# Patient Record
Sex: Male | Born: 1965
Health system: Southern US, Community
[De-identification: ages and names within clinical notes are randomized; demographics above are authoritative.]

## PROBLEM LIST (undated history)

## (undated) DIAGNOSIS — F1010 Alcohol abuse, uncomplicated: Secondary | ICD-10-CM

## (undated) DIAGNOSIS — E039 Hypothyroidism, unspecified: Secondary | ICD-10-CM

## (undated) DIAGNOSIS — F329 Major depressive disorder, single episode, unspecified: Secondary | ICD-10-CM

## (undated) DIAGNOSIS — F32A Depression, unspecified: Secondary | ICD-10-CM

## (undated) DIAGNOSIS — Z201 Contact with and (suspected) exposure to tuberculosis: Secondary | ICD-10-CM

## (undated) DIAGNOSIS — E079 Disorder of thyroid, unspecified: Secondary | ICD-10-CM

## (undated) HISTORY — PX: NO PAST SURGERIES: SHX2092

---

## 2017-12-16 ENCOUNTER — Other Ambulatory Visit: Payer: Self-pay

## 2017-12-16 ENCOUNTER — Inpatient Hospital Stay (HOSPITAL_COMMUNITY)
Admission: EM | Admit: 2017-12-16 | Discharge: 2017-12-22 | DRG: 080 | Disposition: A | Discharge status: Home / Self Care | Payer: Self-pay | Attending: Internal Medicine | Admitting: Internal Medicine

## 2017-12-16 ENCOUNTER — Emergency Department (HOSPITAL_COMMUNITY): Payer: Self-pay

## 2017-12-16 ENCOUNTER — Encounter (HOSPITAL_COMMUNITY): Payer: Self-pay | Admitting: *Deleted

## 2017-12-16 DIAGNOSIS — I959 Hypotension, unspecified: Secondary | ICD-10-CM | POA: Diagnosis present

## 2017-12-16 DIAGNOSIS — Z23 Encounter for immunization: Secondary | ICD-10-CM

## 2017-12-16 DIAGNOSIS — R0602 Shortness of breath: Secondary | ICD-10-CM

## 2017-12-16 DIAGNOSIS — F10129 Alcohol abuse with intoxication, unspecified: Secondary | ICD-10-CM | POA: Diagnosis present

## 2017-12-16 DIAGNOSIS — E039 Hypothyroidism, unspecified: Secondary | ICD-10-CM | POA: Diagnosis present

## 2017-12-16 DIAGNOSIS — F17210 Nicotine dependence, cigarettes, uncomplicated: Secondary | ICD-10-CM | POA: Diagnosis present

## 2017-12-16 DIAGNOSIS — E872 Acidosis: Secondary | ICD-10-CM | POA: Diagnosis present

## 2017-12-16 DIAGNOSIS — G9341 Metabolic encephalopathy: Secondary | ICD-10-CM | POA: Diagnosis present

## 2017-12-16 DIAGNOSIS — E871 Hypo-osmolality and hyponatremia: Secondary | ICD-10-CM | POA: Diagnosis present

## 2017-12-16 DIAGNOSIS — E876 Hypokalemia: Secondary | ICD-10-CM | POA: Diagnosis not present

## 2017-12-16 DIAGNOSIS — E035 Myxedema coma: Principal | ICD-10-CM | POA: Diagnosis present

## 2017-12-16 DIAGNOSIS — Z590 Homelessness: Secondary | ICD-10-CM

## 2017-12-16 DIAGNOSIS — X31XXXA Exposure to excessive natural cold, initial encounter: Secondary | ICD-10-CM

## 2017-12-16 DIAGNOSIS — K5900 Constipation, unspecified: Secondary | ICD-10-CM

## 2017-12-16 DIAGNOSIS — D539 Nutritional anemia, unspecified: Secondary | ICD-10-CM | POA: Diagnosis present

## 2017-12-16 DIAGNOSIS — J449 Chronic obstructive pulmonary disease, unspecified: Secondary | ICD-10-CM | POA: Diagnosis present

## 2017-12-16 DIAGNOSIS — E869 Volume depletion, unspecified: Secondary | ICD-10-CM | POA: Diagnosis present

## 2017-12-16 DIAGNOSIS — Z9114 Patient's other noncompliance with medication regimen: Secondary | ICD-10-CM

## 2017-12-16 DIAGNOSIS — T68XXXA Hypothermia, initial encounter: Secondary | ICD-10-CM | POA: Diagnosis present

## 2017-12-16 HISTORY — DX: Hypothyroidism, unspecified: E03.9

## 2017-12-16 HISTORY — DX: Depression, unspecified: F32.A

## 2017-12-16 HISTORY — DX: Disorder of thyroid, unspecified: E07.9

## 2017-12-16 HISTORY — DX: Contact with and (suspected) exposure to tuberculosis: Z20.1

## 2017-12-16 HISTORY — DX: Major depressive disorder, single episode, unspecified: F32.9

## 2017-12-16 LAB — BASIC METABOLIC PANEL
Anion gap: 8 (ref 5–15)
Anion gap: 7 (ref 5–15)
BUN: 7 mg/dL (ref 6–20)
CHLORIDE: 94 mmol/L — AB (ref 101–111)
CHLORIDE: 95 mmol/L — AB (ref 101–111)
CO2: 20 mmol/L — ABNORMAL LOW (ref 22–32)
CO2: 21 mmol/L — ABNORMAL LOW (ref 22–32)
CREATININE: 0.73 mg/dL (ref 0.61–1.24)
CREATININE: 0.81 mg/dL (ref 0.61–1.24)
Calcium: 7.5 mg/dL — ABNORMAL LOW (ref 8.9–10.3)
Calcium: 7.6 mg/dL — ABNORMAL LOW (ref 8.9–10.3)
GFR calc Af Amer: >60 mL/min (ref >60)
GFR calc Af Amer: >60 mL/min (ref >60)
GFR calc non Af Amer: >60 mL/min (ref >60)
GLUCOSE: 67 mg/dL (ref 65–99)
GLUCOSE: 57 mg/dL — AB (ref 65–99)
POTASSIUM: 3.6 mmol/L (ref 3.5–5.1)
Potassium: 3.4 mmol/L — ABNORMAL LOW (ref 3.5–5.1)
SODIUM: 122 mmol/L — AB (ref 135–145)
SODIUM: 123 mmol/L — AB (ref 135–145)

## 2017-12-16 LAB — I-STAT ARTERIAL BLOOD GAS, ED
Acid-base deficit: 5 mmol/L — ABNORMAL HIGH (ref 0.0–2.0)
Bicarbonate: 20.6 mmol/L (ref 20.0–28.0)
O2 SAT: 86 %
PCO2 ART: 29.7 mmHg — AB (ref 32.0–48.0)
PH ART: 7.424 (ref 7.350–7.450)
Patient temperature: 88.6
TCO2: 22 mmol/L (ref 22–32)
pO2, Arterial: 36 mmHg — CL (ref 83.0–108.0)

## 2017-12-16 LAB — COMPREHENSIVE METABOLIC PANEL
ALBUMIN: 3.1 g/dL — AB (ref 3.5–5.0)
ALT: 52 U/L (ref 17–63)
AST: 246 U/L — AB (ref 15–41)
Alkaline Phosphatase: 78 U/L (ref 38–126)
Anion gap: 18 — ABNORMAL HIGH (ref 5–15)
BUN: 6 mg/dL (ref 6–20)
CHLORIDE: 84 mmol/L — AB (ref 101–111)
CO2: 17 mmol/L — AB (ref 22–32)
CREATININE: 0.71 mg/dL (ref 0.61–1.24)
Calcium: 8.5 mg/dL — ABNORMAL LOW (ref 8.9–10.3)
GFR calc Af Amer: >60 mL/min (ref >60)
GLUCOSE: 107 mg/dL — AB (ref 65–99)
POTASSIUM: 3 mmol/L — AB (ref 3.5–5.1)
Sodium: 119 mmol/L — CL (ref 135–145)
Total Bilirubin: 0.7 mg/dL (ref 0.3–1.2)
Total Protein: 5.7 g/dL — ABNORMAL LOW (ref 6.5–8.1)

## 2017-12-16 LAB — PROTIME-INR
INR: 0.91
PROTHROMBIN TIME: 12.1 s (ref 11.4–15.2)

## 2017-12-16 LAB — CBC WITH DIFFERENTIAL/PLATELET
BASOS ABS: 0 10*3/uL (ref 0.0–0.1)
BASOS PCT: 0 %
EOS PCT: 0 %
Eosinophils Absolute: 0 10*3/uL (ref 0.0–0.7)
HCT: 23.6 % — ABNORMAL LOW (ref 39.0–52.0)
Hemoglobin: 8.4 g/dL — ABNORMAL LOW (ref 13.0–17.0)
Lymphocytes Relative: 13 %
Lymphs Abs: 0.7 10*3/uL (ref 0.7–4.0)
MCH: 34.9 pg — ABNORMAL HIGH (ref 26.0–34.0)
MCHC: 35.6 g/dL (ref 30.0–36.0)
MCV: 97.9 fL (ref 78.0–100.0)
Monocytes Absolute: 0.3 10*3/uL (ref 0.1–1.0)
Monocytes Relative: 6 %
NEUTROS ABS: 4 10*3/uL (ref 1.7–7.7)
Neutrophils Relative %: 81 %
PLATELETS: 157 10*3/uL (ref 150–400)
RBC: 2.41 MIL/uL — AB (ref 4.22–5.81)
RDW: 13 % (ref 11.5–15.5)
WBC: 5 10*3/uL (ref 4.0–10.5)

## 2017-12-16 LAB — GLUCOSE, CAPILLARY
GLUCOSE-CAPILLARY: 61 mg/dL — AB (ref 65–99)
Glucose-Capillary: 133 mg/dL — ABNORMAL HIGH (ref 65–99)

## 2017-12-16 LAB — CORTISOL: CORTISOL PLASMA: 15.1 ug/dL

## 2017-12-16 LAB — URINALYSIS, ROUTINE W REFLEX MICROSCOPIC
BACTERIA UA: NONE SEEN
Glucose, UA: NEGATIVE mg/dL
HGB URINE DIPSTICK: NEGATIVE
KETONES UR: 20 mg/dL — AB
Nitrite: NEGATIVE
Protein, ur: NEGATIVE mg/dL
SPECIFIC GRAVITY, URINE: 1.024 (ref 1.005–1.030)
SQUAMOUS EPITHELIAL / LPF: NONE SEEN
pH: 5 (ref 5.0–8.0)

## 2017-12-16 LAB — I-STAT CG4 LACTIC ACID, ED
Lactic Acid, Venous: 6.48 mmol/L (ref 0.5–1.9)
Lactic Acid, Venous: 1.89 mmol/L (ref 0.5–1.9)

## 2017-12-16 LAB — TSH: TSH: 17.428 u[IU]/mL — ABNORMAL HIGH (ref 0.350–4.500)

## 2017-12-16 LAB — RAPID URINE DRUG SCREEN, HOSP PERFORMED
AMPHETAMINES: NOT DETECTED
Barbiturates: NOT DETECTED
Benzodiazepines: NOT DETECTED
COCAINE: NOT DETECTED
OPIATES: NOT DETECTED
Tetrahydrocannabinol: POSITIVE — AB

## 2017-12-16 LAB — TROPONIN I: Troponin I: <0.03 ng/mL (ref <0.03)

## 2017-12-16 LAB — T4, FREE: Free T4: <0.25 ng/dL — ABNORMAL LOW (ref 0.61–1.12)

## 2017-12-16 LAB — ACETAMINOPHEN LEVEL: Acetaminophen (Tylenol), Serum: <10 ug/mL — ABNORMAL LOW (ref 10–30)

## 2017-12-16 LAB — MAGNESIUM: MAGNESIUM: 1.8 mg/dL (ref 1.7–2.4)

## 2017-12-16 LAB — ETHANOL

## 2017-12-16 LAB — SALICYLATE LEVEL: Salicylate Lvl: <7 mg/dL (ref 2.8–30.0)

## 2017-12-16 LAB — AMMONIA: Ammonia: 17 umol/L (ref 9–35)

## 2017-12-16 LAB — LIPASE, BLOOD: Lipase: 82 U/L — ABNORMAL HIGH (ref 11–51)

## 2017-12-16 MED ORDER — LEVOTHYROXINE SODIUM 100 MCG IV SOLR
50.0000 ug | Freq: Every day | INTRAVENOUS | Status: DC
  Administered 2017-12-17: 50 ug via INTRAVENOUS
  Filled 2017-12-16 (×2): qty 5

## 2017-12-16 MED ORDER — VANCOMYCIN HCL IN DEXTROSE 1-5 GM/200ML-% IV SOLN
1000.0000 mg | Freq: Three times a day (TID) | INTRAVENOUS | Status: DC
  Administered 2017-12-17 – 2017-12-18 (×5): 1000 mg via INTRAVENOUS
  Filled 2017-12-16 (×6): qty 200

## 2017-12-16 MED ORDER — PIPERACILLIN-TAZOBACTAM 3.375 G IVPB
3.3750 g | Freq: Three times a day (TID) | INTRAVENOUS | Status: DC
  Administered 2017-12-16 – 2017-12-18 (×6): 3.375 g via INTRAVENOUS
  Filled 2017-12-16 (×7): qty 50

## 2017-12-16 MED ORDER — HEPARIN SODIUM (PORCINE) 5000 UNIT/ML IJ SOLN
5000.0000 [IU] | Freq: Three times a day (TID) | INTRAMUSCULAR | Status: DC
  Administered 2017-12-16 – 2017-12-22 (×14): 5000 [IU] via SUBCUTANEOUS
  Filled 2017-12-16 (×16): qty 1

## 2017-12-16 MED ORDER — VANCOMYCIN HCL 10 G IV SOLR
1500.0000 mg | Freq: Once | INTRAVENOUS | Status: AC
  Administered 2017-12-16: 1500 mg via INTRAVENOUS
  Filled 2017-12-16: qty 1500

## 2017-12-16 MED ORDER — DEXTROSE 50 % IV SOLN
50.0000 mL | Freq: Once | INTRAVENOUS | Status: AC
  Administered 2017-12-16: 50 mL via INTRAVENOUS
  Filled 2017-12-16: qty 50

## 2017-12-16 MED ORDER — PIPERACILLIN-TAZOBACTAM 3.375 G IVPB 30 MIN
3.3750 g | Freq: Once | INTRAVENOUS | Status: AC
  Administered 2017-12-16: 3.375 g via INTRAVENOUS
  Filled 2017-12-16: qty 50

## 2017-12-16 MED ORDER — VANCOMYCIN HCL IN DEXTROSE 1-5 GM/200ML-% IV SOLN: 1000.0000 mg | Freq: Once | INTRAVENOUS | Status: DC

## 2017-12-16 MED ORDER — SODIUM CHLORIDE 0.9 % IV BOLUS (SEPSIS)
1000.0000 mL | Freq: Once | INTRAVENOUS | Status: AC
  Administered 2017-12-16: 1000 mL via INTRAVENOUS

## 2017-12-16 MED ORDER — DEXTROSE 50 % IV SOLN: 50.0000 mL | Freq: Once | INTRAVENOUS | Status: DC

## 2017-12-16 MED ORDER — HYDROCORTISONE NA SUCCINATE PF 100 MG IJ SOLR
100.0000 mg | Freq: Three times a day (TID) | INTRAMUSCULAR | Status: DC
  Administered 2017-12-16 – 2017-12-20 (×12): 100 mg via INTRAVENOUS
  Filled 2017-12-16 (×13): qty 2

## 2017-12-16 MED ORDER — ONDANSETRON HCL 4 MG/2ML IJ SOLN: 4.0000 mg | Freq: Four times a day (QID) | INTRAMUSCULAR | Status: DC | PRN

## 2017-12-16 MED ORDER — SODIUM CHLORIDE 0.9 % IV SOLN
INTRAVENOUS | Status: DC
  Administered 2017-12-16 – 2017-12-19 (×6): via INTRAVENOUS

## 2017-12-16 MED ORDER — POTASSIUM CHLORIDE 10 MEQ/100ML IV SOLN
10.0000 meq | INTRAVENOUS | Status: AC
  Administered 2017-12-16 (×3): 10 meq via INTRAVENOUS
  Filled 2017-12-16 (×3): qty 100

## 2017-12-16 MED ORDER — ALBUTEROL SULFATE (2.5 MG/3ML) 0.083% IN NEBU
2.5000 mg | INHALATION_SOLUTION | RESPIRATORY_TRACT | Status: DC | PRN
  Filled 2017-12-16: qty 3

## 2017-12-16 MED ORDER — SODIUM CHLORIDE 0.9 % IV SOLN: 250.0000 mL | INTRAVENOUS | Status: DC | PRN

## 2017-12-16 MED ORDER — SODIUM CHLORIDE 0.9 % IV BOLUS (SEPSIS)
1000.0000 mL | Freq: Once | INTRAVENOUS | Status: AC
Start: 2017-12-16 — End: 2017-12-16
  Administered 2017-12-16: 1000 mL via INTRAVENOUS

## 2017-12-16 NOTE — ED Provider Notes (Signed)
[facility] EMERGENCY DEPARTMENT Provider Note   CSN: [id number] Arrival date & time: 12/16/17  0703     History   Chief Complaint Chief Complaint  Patient presents with  . Alcohol Intoxication    HPI Bruce Simpson is a 52 y.o. male.  The history is provided by the patient and the EMS personnel. No language interpreter was used.  Alcohol Intoxication  This is a recurrent problem. The current episode started 12 to 24 hours ago. The problem occurs constantly. The problem has not changed since onset.Pertinent negatives include no chest pain, no abdominal pain, no headaches and no shortness of breath. Nothing aggravates the symptoms. Nothing relieves the symptoms. He has tried nothing for the symptoms. The treatment provided no relief.    Past Medical History:  Diagnosis Date  . Thyroid disease     There are no active problems to display for this patient.    Home Medications    Prior to Admission medications   Not on File    Family History No family history on file.  Social History Social History   Tobacco Use  . Smoking status: Current Some Day Smoker  . Smokeless tobacco: Never Used  Substance Use Topics  . Alcohol use: Yes  . Drug use: No     Allergies   Patient has no known allergies.   Review of Systems Review of Systems  Constitutional: Positive for fatigue. Negative for appetite change, chills, diaphoresis and fever.  HENT: Negative for congestion.   Eyes: Negative for visual disturbance.  Respiratory: Negative for cough, chest tightness, shortness of breath, wheezing and stridor.   Cardiovascular: Negative for chest pain, palpitations and leg swelling.  Gastrointestinal: Negative for abdominal pain, constipation, diarrhea, nausea and vomiting.  Genitourinary: Positive for difficulty urinating and frequency. Negative for discharge, dysuria, hematuria, penile pain, penile swelling, scrotal swelling and testicular pain.    Musculoskeletal: Negative for back pain, neck pain and neck stiffness.  Skin: Negative for rash and wound.  Neurological: Negative for dizziness, seizures, syncope, speech difficulty, weakness, light-headedness, numbness and headaches.  Psychiatric/Behavioral: Positive for confusion. Negative for agitation.       Pt feels "discombobulated"   All other systems reviewed and are negative.    Physical Exam Updated Vital Signs BP (!) 85/66   Pulse (!) 53   Temp (!) 88.6 F (31.4 C) (Rectal)   Resp 11   Ht 6\' 1"  (1.854 m)   Wt 90.7 kg (200 lb)   SpO2 98%   BMI 26.39 kg/m   Physical Exam  Constitutional: He is oriented to person, place, and time. He appears well-developed and well-nourished. No distress.  Covered in mud  HENT:  Head: Normocephalic and atraumatic.  Nose: Nose normal.  Mouth/Throat: Oropharynx is clear and moist. No oropharyngeal exudate.  Eyes: Conjunctivae and EOM are normal. Pupils are equal, round, and reactive to light.  Neck: Normal range of motion. Neck supple.  Cardiovascular: Normal heart sounds and intact distal pulses. Bradycardia present.  No murmur heard. Pulmonary/Chest: Effort normal and breath sounds normal. No stridor. No respiratory distress. He has no wheezes. He exhibits no tenderness.  Abdominal: Soft. Bowel sounds are normal. He exhibits no distension. There is no tenderness.  Musculoskeletal: He exhibits no edema, tenderness or deformity.  Neurological: He is alert and oriented to person, place, and time. No cranial nerve deficit or sensory deficit. He exhibits normal muscle tone. Coordination (gait deferred initiallu) normal.  Skin: Capillary refill takes less than  2 seconds. He is not diaphoretic. No erythema. No pallor.  Nursing note and vitals reviewed.    ED Treatments / Results  Labs (all labs ordered are listed, but only abnormal results are displayed) Labs Reviewed  CBC WITH DIFFERENTIAL/PLATELET - Abnormal; Notable for the  following components:      Result Value   RBC 2.41 (*)    Hemoglobin 8.4 (*)    HCT 23.6 (*)    MCH 34.9 (*)    All other components within normal limits  COMPREHENSIVE METABOLIC PANEL - Abnormal; Notable for the following components:   Sodium 119 (*)    Potassium 3.0 (*)    Chloride 84 (*)    CO2 17 (*)    Glucose, Bld 107 (*)    Calcium 8.5 (*)    Total Protein 5.7 (*)    Albumin 3.1 (*)    AST 246 (*)    Anion gap 18 (*)    All other components within normal limits  LIPASE, BLOOD - Abnormal; Notable for the following components:   Lipase 82 (*)    All other components within normal limits  RAPID URINE DRUG SCREEN, HOSP PERFORMED - Abnormal; Notable for the following components:   Tetrahydrocannabinol POSITIVE (*)    All other components within normal limits  URINALYSIS, ROUTINE W REFLEX MICROSCOPIC - Abnormal; Notable for the following components:   Color, Urine AMBER (*)    APPearance HAZY (*)    Bilirubin Urine SMALL (*)    Ketones, ur 20 (*)    Leukocytes, UA TRACE (*)    All other components within normal limits  ACETAMINOPHEN LEVEL - Abnormal; Notable for the following components:   Acetaminophen (Tylenol), Serum <10 (*)    All other components within normal limits  TSH - Abnormal; Notable for the following components:   TSH 17.428 (*)    All other components within normal limits  T4, FREE - Abnormal; Notable for the following components:   Free T4 <0.25 (*)    All other components within normal limits  BASIC METABOLIC PANEL - Abnormal; Notable for the following components:   Sodium 122 (*)    Potassium 3.4 (*)    Chloride 94 (*)    CO2 20 (*)    Calcium 7.5 (*)    All other components within normal limits  I-STAT CG4 LACTIC ACID, ED - Abnormal; Notable for the following components:   Lactic Acid, Venous 6.48 (*)    All other components within normal limits  I-STAT ARTERIAL BLOOD GAS, ED - Abnormal; Notable for the following components:   pCO2 arterial 29.7  (*)    pO2, Arterial 36.0 (*)    Acid-base deficit 5.0 (*)    All other components within normal limits  URINE CULTURE  CULTURE, BLOOD (ROUTINE X 2)  CULTURE, BLOOD (ROUTINE X 2)  PROTIME-INR  ETHANOL  MAGNESIUM  AMMONIA  SALICYLATE LEVEL  CORTISOL  TROPONIN I  HIV ANTIBODY (ROUTINE TESTING)  TROPONIN I  TROPONIN I  OSMOLALITY, URINE  CHLORIDE, URINE, RANDOM  SODIUM, URINE, RANDOM  BASIC METABOLIC PANEL  BASIC METABOLIC PANEL  BASIC METABOLIC PANEL  CBC  MAGNESIUM  PHOSPHORUS  HEPATIC FUNCTION PANEL  I-STAT CG4 LACTIC ACID, ED    EKG  EKG Interpretation  Date/Time:  Tuesday December 16 2017 09:54:20 EST Ventricular Rate:  47 PR Interval:    QRS Duration: 140 QT Interval:  588 QTC Calculation: 520 R Axis:   67 Text Interpretation:  Sinus bradycardia Nonspecific  intraventricular conduction delay When compared to prior, slower rate.  no STEMI Confirmed by Theda Belfast ([id number]) on 12/16/2017 10:05:44 AM       Radiology Ct Head Wo Contrast  Result Date: 12/16/2017 CLINICAL DATA:  Unwitnessed fall with altered level of consciousness EXAM: CT HEAD WITHOUT CONTRAST TECHNIQUE: Contiguous axial images were obtained from the base of the skull through the vertex without intravenous contrast. COMPARISON:  None. FINDINGS: Brain: There is mild atrophy for age. There is prominence of the cisterna magna, a presumed anatomic variant. There is no appreciable intracranial mass, hemorrhage, extra-axial fluid collection, or midline shift. There is minimal small vessel disease in the centra semiovale bilaterally. Elsewhere gray-white compartments appear normal. No evident acute infarct. Vascular: No hyperdense vessel. There is mild calcification in the right carotid siphon. Skull: Bony calvarium appears intact. Sinuses/Orbits: There is slight mucosal thickening in several ethmoid air cells. There is a rather minimal retention cyst in the medial left maxillary antrum. Orbits appear symmetric  bilaterally. Other: Mastoid air cells appear clear. IMPRESSION: Mild atrophy for age. Minimal periventricular small vessel disease. No mass, hemorrhage, or acute infarct. Mild arterial vascular calcification.  Mild paranasal sinus disease. Electronically Signed   By: Bretta Bang III M.D.   On: 12/16/2017 09:20   Dg Chest Portable 1 View  Result Date: 12/16/2017 CLINICAL DATA:  Altered mental status and weakness EXAM: PORTABLE CHEST 1 VIEW COMPARISON:  None. FINDINGS: Cardiac shadow is within normal limits. Lungs are well aerated bilaterally. Emphysematous changes are noted concentrated in the right upper lobe. No focal infiltrate or sizable effusion is seen. No bony abnormality is noted. IMPRESSION: COPD without acute abnormality. Electronically Signed   By: Alcide Clever M.D.   On: 12/16/2017 10:28    Procedures Procedures (including critical care time)  CRITICAL CARE Performed by: Canary Brim Tegeler Total critical care time: 75 minutes Critical care time was exclusive of separately billable procedures and treating other patients. Critical care was necessary to treat or prevent imminent or life-threatening deterioration. Critical care was time spent personally by me on the following activities: development of treatment plan with patient and/or surrogate as well as nursing, discussions with consultants, evaluation of patient's response to treatment, examination of patient, obtaining history from patient or surrogate, ordering and performing treatments and interventions, ordering and review of laboratory studies, ordering and review of radiographic studies, pulse oximetry and re-evaluation of patient's condition.   Medications Ordered in ED Medications  piperacillin-tazobactam (ZOSYN) IVPB 3.375 g (3.375 g Intravenous New Bag/Given 12/16/17 1507)  vancomycin (VANCOCIN) IVPB 1000 mg/200 mL premix (not administered)  0.9 %  sodium chloride infusion (not administered)  heparin injection 5,000  Units (not administered)  0.9 %  sodium chloride infusion ( Intravenous New Bag/Given 12/16/17 1506)  ondansetron (ZOFRAN) injection 4 mg (not administered)  albuterol (PROVENTIL) (2.5 MG/3ML) 0.083% nebulizer solution 2.5 mg (not administered)  hydrocortisone sodium succinate (SOLU-CORTEF) 100 MG injection 100 mg (100 mg Intravenous Given 12/16/17 1504)  levothyroxine (SYNTHROID, LEVOTHROID) injection 50 mcg (not administered)  sodium chloride 0.9 % bolus 1,000 mL (0 mLs Intravenous Stopped 12/16/17 0808)  piperacillin-tazobactam (ZOSYN) IVPB 3.375 g (0 g Intravenous Stopped 12/16/17 0918)  vancomycin (VANCOCIN) 1,500 mg in sodium chloride 0.9 % 500 mL IVPB (0 mg Intravenous Stopped 12/16/17 1032)  sodium chloride 0.9 % bolus 1,000 mL (0 mLs Intravenous Stopped 12/16/17 1119)  sodium chloride 0.9 % bolus 1,000 mL (0 mLs Intravenous Stopped 12/16/17 1119)  potassium chloride 10 mEq in 100 mL IVPB (  0 mEq Intravenous Stopped 12/16/17 1344)  sodium chloride 0.9 % bolus 1,000 mL (0 mLs Intravenous Stopped 12/16/17 1437)     Initial Impression / Assessment and Plan / ED Course  I have reviewed the triage vital signs and the nursing notes.  Pertinent labs & imaging results that were available during my care of the patient were reviewed by me and considered in my medical decision making (see chart for details).     Bruce Simpson is a 53 y.o. male with an unknown past medical history who presents by EMS for alcohol intoxication and altered mental status.  According to EMS, patient was found by law enforcement behind a liquor store on the ground.  When he was assessed, patient was acting confused so EMS was called.  EMS reports that they transported him several days ago but were unsure about the circumstances.  Patient denies any history of medical problems.  Patient reports that he drank approximately 3 beers last night.  He denies any drug use or other medication use.  He denies any recent fevers, chills, chest  pain, shortness of breath, nausea, vomiting, constipation, or diarrhea.  He does report that he had a fall in the woods last night and he thinks he hit his head but he denies loss of consciousness.  He denies taking any medications or blood thinners.  He denies any headache, vision changes, neck pain, or neck stiffness.  He feels dehydrated but does report that he has had some urinary hesitancy, difficulty urinating, and frequency.  He denies any other complaints specifically no pains.  Next  EMS was concerned because patient's heart rate was in the 40s and 50s during transport.  Patient also had a soft blood pressure that responded well to normal saline.  According to EMS, blood pressure was in the 80s when they picked him up but was above 100 upon arrival to the ED.  On exam, patient is alert and oriented x3.  Patient had no focal neurologic deficits in regards to sensation, coordination, and eye exam.  Gait was deferred as patient still feels "discombobulated".  Patient's lungs were clear and back, neck, chest, and abdomen were nontender.  No evidence of trauma seen however patient was covered in blood.  Patient will have screening workup to look for traumatic injuries of the head including CT scan.  Patient also have workup to look for occult infection in his lungs or urine.  Patient will have laboratory testing to look for signs of intoxication, electrode abnormalities, or severe dehydration.  Patient was given fluids during initial workup.  7:35 AM Rectal temp was found to be 84.1.  Patient was wet and cold on initial evaluation.  Patient will be given warm fluids and the bear hugger will be initiated.  Suspect this might be the cause of the patient's confusion and bradycardia.  7:43 AM Laboratory reports that patient's lactic acid is 6.48.  In the setting of the reported hypotension, altered mental status, urinary symptoms, bradycardia, and temperature of 84 degrees rectally, patient will be  made a code sepsis and broad-spectrum antibiotics will be started.  Cultures will be obtained.  Urinalysis returned showing Leukocytes but no bacteria.  No nitrites. Still unclear source if patient has infection as chest x-ray shows no pneumonia.  CT head also revealed mild atrophy for age with some small vessel disease but no acute abnormality.  TSH was added given the hypothermia, bradycardia, hypotension, and hyponatremia.  TSH returned at 17.4.  At this  time, I am concerned about myxedema coma as etiology of patient's constellation of symptoms.  Cortisol and free T4 levels were ordered.  Repeat rectal temperature after bear hugger and warm fluids had increased to 88 degrees rectal.  Patient will continue rewarming.  Lactic acid cleared after fluids.  Lipase was slightly elevated.  Due to continued intermittent hypotension and the hypothermia with the concern for possible myxedema, critical care will be called for guidance.  PT will be admitted to critical care service.     Final Clinical Impressions(s) / ED Diagnoses   Final diagnoses:  Hypotension, unspecified hypotension type  Hypothermia, initial encounter  Hyponatremia    ED Discharge Orders    None      Clinical Impression: 1. Hypotension, unspecified hypotension type   2. Hypothermia, initial encounter   3. Hyponatremia     Disposition: Admit  This note was prepared with assistance of Dragon voice recognition software. Occasional wrong-word or sound-a-like substitutions may have occurred due to the inherent limitations of voice recognition software.     Tegeler, Canary Brim, MD 12/16/17 [phone]

## 2017-12-16 NOTE — ED Notes (Signed)
Dr. Rush Landmarkegeler aware of BP. Another bolus ordered, warm saline hung.

## 2017-12-16 NOTE — ED Notes (Signed)
Pt Bruce Simpson and follow command. M=inimal verbal response.

## 2017-12-16 NOTE — ED Triage Notes (Signed)
Pt arrived by EMS: was found behind a liquor store this morning. Pt's friend called EMS due to patient "acting differently." Pt had three large beers to drink; has had urinary urgency and had a fall last night. Initial blood pressure 80/40, which improved after a liter of fluid

## 2017-12-16 NOTE — ED Notes (Signed)
Pt is resting, will have periods of bradycardia, as low as 38. Appears to have sleep apnea. Oxygen sats, rise upon awakening, as does HR.

## 2017-12-16 NOTE — Progress Notes (Signed)
Pharmacy Antibiotic Note  Bruce Simpson is a 52 y.o. male admitted on 12/16/2017 with alcohol intoxication. Pharmacy has been consulted for vancomycin and zosyn dosing for sepsis. WBC is WNL and SCr is WNL. Lactic acid is significantly elevated at 6.48. Pt unable to provide his weight.   Plan: Vancomycin 1500mg  IV x 1 then 1g IV Q8H Zosyn 3.375gm IV Q8H (4 hr inf) F/u renal fxn, C&S, clinical status and trough at SS *Will update doses once pts weight is available    No data recorded.  No results for input(s): WBC, CREATININE, LATICACIDVEN, VANCOTROUGH, VANCOPEAK, VANCORANDOM, GENTTROUGH, GENTPEAK, GENTRANDOM, TOBRATROUGH, TOBRAPEAK, TOBRARND, AMIKACINPEAK, AMIKACINTROU, AMIKACIN in the last 168 hours.  CrCl cannot be calculated (No order found.).    No Known Allergies  Antimicrobials this admission: Vanc 1/8>> Zosyn 1/8>>  Dose adjustments this admission: N/A  Microbiology results: Pending  Thank you for allowing pharmacy to be a part of this patient's care.  Eshal Propps, Drake LeachRachel Lynn 12/16/2017 7:51 AM

## 2017-12-16 NOTE — Progress Notes (Signed)
Reported labs to resident. Paged [phone]. Reported that pt cannot take PO synthroid. Pt is on CPAP. Pt has Phos and Mag levels due in morning. 1AMP D50% was given. Will recheck blood sugar. Pts temp reported as well. Pt Bair Hugger in place.

## 2017-12-16 NOTE — H&P (Signed)
PULMONARY / CRITICAL CARE MEDICINE   Name: Bruce Simpson MRN: [id number] DOB: [birth year]    ADMISSION DATE:  12/16/2017 CONSULTATION DATE:  12/16/17  REFERRING MD:  Dr. Julieanne Mansonegler / EDP   CHIEF COMPLAINT:  Altered mental status, hypothermia, hypotension   HISTORY OF PRESENT ILLNESS:   52 y/o M who [facility] via EMS after being found behind a liquor store this am.  His friend called EMS because he was "acting differently".    Reportedly the patient had three large beers to drink the evening prior.  EMS reported urinary urgency and a fall on 1/7 pm.  His was initially noted to have hypotension (80's systolic), bradycardia and hypothermia (rectal temp 83). The patient reports he takes medicine for his thyroid but can not state what or when he last took them.    Initial labs- Na 119, K 3.0, cl 84, CO2 17, glucose 107, BUN 6 / sr cr 0.71, AG 18, Mg 1.8, albumin 3.1, lipase 82, AST 246, ALT 52, lactic acid 6.48 > cleared to 1.89, WBC 5, Hgb 8.4, platelets 157.  ETOH <10, salicylate <7, TSH 17.428, T4 <0.25.  CXR with changes consistent with emphysema / scarring but no overt infiltrate.  CT head negative for acute process.  As patient was re-warmed, mental status improved.    PCCM called for ICU admit.   PAST MEDICAL HISTORY :  He  has a past medical history of Thyroid disease.  PAST SURGICAL HISTORY: He  has no past surgical history on file.  No Known Allergies  No current facility-administered medications on file prior to encounter.    No current outpatient medications on file prior to encounter.    FAMILY HISTORY:  His has no family status information on file.   SOCIAL HISTORY: He  reports that he has been smoking.  he has never used smokeless tobacco. He reports that he drinks alcohol. He reports that he does not use drugs.  REVIEW OF SYSTEMS:   Unable to complete with patient due to AMS  SUBJECTIVE:  As above  VITAL SIGNS: BP (!) 85/71   Pulse 60   Temp (!) 88.6  F (31.4 C) (Rectal)   Resp 12   Ht 6\' 1"  (1.854 m)   Wt 200 lb (90.7 kg)   SpO2 98%   BMI 26.39 kg/m   HEMODYNAMICS:    VENTILATOR SETTINGS:    INTAKE / OUTPUT: No intake/output data recorded.  PHYSICAL EXAMINATION: General: chronically ill appearing male in NAD lying on ER stretcher  HEENT: MM pink/dry, split lower lip which is not new Neuro: lethargic, responds appropriately, reflexes +2 CV: s1s2 rrr, no m/r/g PULM: even/non-labored, lungs bilaterally  XB:JYNWGI:soft, non-tender, bsx4 active  Extremities: cool to touch, dry flaky skin, generalized edema  Skin: no rashes or lesions. Multiple tattoos.   LABS:  BMET Recent Labs  Lab 12/16/17 0724  NA 119*  K 3.0*  CL 84*  CO2 17*  BUN 6  CREATININE 0.71  GLUCOSE 107*    Electrolytes Recent Labs  Lab 12/16/17 0724  CALCIUM 8.5*  MG 1.8    CBC Recent Labs  Lab 12/16/17 0724  WBC 5.0  HGB 8.4*  HCT 23.6*  PLT 157    Coag's Recent Labs  Lab 12/16/17 0724  INR 0.91    Sepsis Markers Recent Labs  Lab 12/16/17 0740 12/16/17 0954  LATICACIDVEN 6.48* 1.89    ABG Recent Labs  Lab 12/16/17 1158  PHART 7.424  PCO2ART  29.7*  PO2ART 36.0*    Liver Enzymes Recent Labs  Lab 12/16/17 0724  AST 246*  ALT 52  ALKPHOS 78  BILITOT 0.7  ALBUMIN 3.1*    Cardiac Enzymes No results for input(s): TROPONINI, PROBNP in the last 168 hours.  Glucose No results for input(s): GLUCAP in the last 168 hours.  Imaging Ct Head Wo Contrast  Result Date: 12/16/2017 CLINICAL DATA:  Unwitnessed fall with altered level of consciousness EXAM: CT HEAD WITHOUT CONTRAST TECHNIQUE: Contiguous axial images were obtained from the base of the skull through the vertex without intravenous contrast. COMPARISON:  None. FINDINGS: Brain: There is mild atrophy for age. There is prominence of the cisterna magna, a presumed anatomic variant. There is no appreciable intracranial mass, hemorrhage, extra-axial fluid collection, or  midline shift. There is minimal small vessel disease in the centra semiovale bilaterally. Elsewhere gray-white compartments appear normal. No evident acute infarct. Vascular: No hyperdense vessel. There is mild calcification in the right carotid siphon. Skull: Bony calvarium appears intact. Sinuses/Orbits: There is slight mucosal thickening in several ethmoid air cells. There is a rather minimal retention cyst in the medial left maxillary antrum. Orbits appear symmetric bilaterally. Other: Mastoid air cells appear clear. IMPRESSION: Mild atrophy for age. Minimal periventricular small vessel disease. No mass, hemorrhage, or acute infarct. Mild arterial vascular calcification.  Mild paranasal sinus disease. Electronically Signed   By: Bretta Bang III M.D.   On: 12/16/2017 09:20   Dg Chest Portable 1 View  Result Date: 12/16/2017 CLINICAL DATA:  Altered mental status and weakness EXAM: PORTABLE CHEST 1 VIEW COMPARISON:  None. FINDINGS: Cardiac shadow is within normal limits. Lungs are well aerated bilaterally. Emphysematous changes are noted concentrated in the right upper lobe. No focal infiltrate or sizable effusion is seen. No bony abnormality is noted. IMPRESSION: COPD without acute abnormality. Electronically Signed   By: Alcide Clever M.D.   On: 12/16/2017 10:28   STUDIES:  Cortisol 1/8 >> 15.1 UDS 1/8 >> positive for marijuana  TSH 1/8 >> 17.428  T4 1/8 >> less than 0.25 ABG 1/8>>>7.424/29.7/36.0/20.6 UA 1/8 >>> 20 ketones, trace leukocytes, no bacteria, 6-30 WBC  CULTURES: BCx2 1/8 >>>  UC 1/8 >>>   ANTIBIOTICS: Vanco 1/8 >>> Zosyn 1/8 >>>   SIGNIFICANT EVENTS: 1/08  Admit with AMS, hypothermia, elevated TSH, bradycardia initially  LINES/TUBES: PIV x 2  DISCUSSION: 52 y/o M who presented to [facility] on 1/8 with AMS, hypothermia, bradycardia, hyponatremia. He responded well to initial fluid resuscitation. He has a history of hypothyroidism as per patient.   ASSESSMENT /  PLAN:  PULMONARY A: At Risk Aspiration - in setting of AMS P:   Patient saturating well on nasal cannula  Has not required advanced support Albuterol nebs q2 hours PRN CXR clear, no acute process noted. Consistent with COPD enlargement of the lung fields.   CARDIOVASCULAR A:  Bradycardia- improving  Hypotension- stable but improving Hypothermia-improving with minimal treatment P:  Monitor blood pressure and vitals Continue fluid rehydration at 76ml/hr Received ~6 liters fluid total  RENAL A:   Hyponatremia  Hypokalemia  Lactic Acidosis - cleared  No AKI noted, Serum Cr-0.71 P:   Replaced K+ with of IV potassium  BMET's q4 hours to monitor Na, goal of increase of 31mmol/hr max for 24 hour period  Given the rate of fluid resuscitation thus far we may need to utilize D5W. Unclear if this is an acute or chronic issue.   GASTROINTESTINAL A:   Elevated AST  to 246, ALT 52 P:   Monitor with hepatic function panel in AM  HEMATOLOGIC A:   Anemia unknown if this is acute/chronic at this time.  8.4 on 1/8, Transfusion goal of Hgb 7 given lack of additional risk factors.  P:  Transfuse at Hgb 7 Will continue to monitor w/ CBC in am No active signs of blood loss, continue to monitor for blood loss  INFECTIOUS A:   R/O Infectious Etiology Hypothermia / Hypotension. WBC 5.0 No clear etiology for infection at this time. Will continue to evaluate pending culture results  P:   Culture results as above Continue Vancomycin and Zosyn LA trended down from 6.48-1.89  ENDOCRINE A:   ? Hx Hypothyroidism - pt reports taking medication but can't state when / how much  R/O Myxedema coma Patient presented found down, hypothermic, bradycardic, hypotensive, hyponatremic, encephalopathy-this could be explained by being found [place] outside [place]   P:   Monitor CBG Q4 hours Hydrocortisone 100mg  q8 hours  NEUROLOGIC A:   Acute Encephalopathy  ETOH Abuse Acetaminophen 10.0  and Salicylate <7.0 Ammonia 17 P:   RASS goal: Not currently indicated Continue to monitor mental status for improvement  FAMILY  - Updates:   - Inter-disciplinary family meet or Palliative Care meeting due by:  Day 7   Lanelle Bal, MD  Pulmonary and Critical Care Medicine [facility] Pager: [phone]  12/16/2017, 2:22 PM

## 2017-12-17 ENCOUNTER — Inpatient Hospital Stay (HOSPITAL_COMMUNITY): Payer: Self-pay

## 2017-12-17 ENCOUNTER — Other Ambulatory Visit: Payer: Self-pay

## 2017-12-17 ENCOUNTER — Encounter (HOSPITAL_COMMUNITY): Payer: Self-pay | Admitting: General Practice

## 2017-12-17 DIAGNOSIS — E035 Myxedema coma: Principal | ICD-10-CM

## 2017-12-17 DIAGNOSIS — G9341 Metabolic encephalopathy: Secondary | ICD-10-CM

## 2017-12-17 DIAGNOSIS — E871 Hypo-osmolality and hyponatremia: Secondary | ICD-10-CM

## 2017-12-17 DIAGNOSIS — I959 Hypotension, unspecified: Secondary | ICD-10-CM

## 2017-12-17 DIAGNOSIS — T68XXXA Hypothermia, initial encounter: Secondary | ICD-10-CM

## 2017-12-17 LAB — BASIC METABOLIC PANEL
ANION GAP: 7 (ref 5–15)
ANION GAP: 8 (ref 5–15)
ANION GAP: 6 (ref 5–15)
ANION GAP: 9 (ref 5–15)
BUN: 7 mg/dL (ref 6–20)
BUN: 5 mg/dL — ABNORMAL LOW (ref 6–20)
BUN: <5 mg/dL — ABNORMAL LOW (ref 6–20)
BUN: <5 mg/dL — ABNORMAL LOW (ref 6–20)
CALCIUM: 7.7 mg/dL — AB (ref 8.9–10.3)
CALCIUM: 8.1 mg/dL — AB (ref 8.9–10.3)
CALCIUM: 8.3 mg/dL — AB (ref 8.9–10.3)
CHLORIDE: 104 mmol/L (ref 101–111)
CHLORIDE: 101 mmol/L (ref 101–111)
CO2: 20 mmol/L — ABNORMAL LOW (ref 22–32)
CO2: 21 mmol/L — ABNORMAL LOW (ref 22–32)
CO2: 18 mmol/L — AB (ref 22–32)
CO2: 21 mmol/L — AB (ref 22–32)
Calcium: 7.6 mg/dL — ABNORMAL LOW (ref 8.9–10.3)
Chloride: 94 mmol/L — ABNORMAL LOW (ref 101–111)
Chloride: 100 mmol/L — ABNORMAL LOW (ref 101–111)
Creatinine, Ser: 0.82 mg/dL (ref 0.61–1.24)
Creatinine, Ser: 0.93 mg/dL (ref 0.61–1.24)
Creatinine, Ser: 1.04 mg/dL (ref 0.61–1.24)
Creatinine, Ser: 1.1 mg/dL (ref 0.61–1.24)
GFR calc non Af Amer: >60 mL/min (ref >60)
GFR calc non Af Amer: >60 mL/min (ref >60)
GLUCOSE: 87 mg/dL (ref 65–99)
GLUCOSE: 83 mg/dL (ref 65–99)
GLUCOSE: 115 mg/dL — AB (ref 65–99)
Glucose, Bld: 45 mg/dL — ABNORMAL LOW (ref 65–99)
POTASSIUM: 3.6 mmol/L (ref 3.5–5.1)
POTASSIUM: 3.6 mmol/L (ref 3.5–5.1)
Potassium: 3.4 mmol/L — ABNORMAL LOW (ref 3.5–5.1)
Potassium: 3.7 mmol/L (ref 3.5–5.1)
SODIUM: 127 mmol/L — AB (ref 135–145)
Sodium: 123 mmol/L — ABNORMAL LOW (ref 135–145)
Sodium: 130 mmol/L — ABNORMAL LOW (ref 135–145)
Sodium: 129 mmol/L — ABNORMAL LOW (ref 135–145)

## 2017-12-17 LAB — HEPATIC FUNCTION PANEL
ALT: 44 U/L (ref 17–63)
AST: 189 U/L — AB (ref 15–41)
Albumin: 2.5 g/dL — ABNORMAL LOW (ref 3.5–5.0)
Alkaline Phosphatase: 62 U/L (ref 38–126)
Bilirubin, Direct: <0.1 mg/dL — ABNORMAL LOW (ref 0.1–0.5)
TOTAL PROTEIN: 4.7 g/dL — AB (ref 6.5–8.1)
Total Bilirubin: 0.8 mg/dL (ref 0.3–1.2)

## 2017-12-17 LAB — GLUCOSE, CAPILLARY
GLUCOSE-CAPILLARY: 90 mg/dL (ref 65–99)
GLUCOSE-CAPILLARY: 115 mg/dL — AB (ref 65–99)
GLUCOSE-CAPILLARY: 115 mg/dL — AB (ref 65–99)
Glucose-Capillary: 75 mg/dL (ref 65–99)
Glucose-Capillary: 85 mg/dL (ref 65–99)

## 2017-12-17 LAB — PHOSPHORUS: Phosphorus: 3.5 mg/dL (ref 2.5–4.6)

## 2017-12-17 LAB — CBC
HEMATOCRIT: 21.8 % — AB (ref 39.0–52.0)
HEMOGLOBIN: 7.5 g/dL — AB (ref 13.0–17.0)
MCH: 34.6 pg — ABNORMAL HIGH (ref 26.0–34.0)
MCHC: 34.4 g/dL (ref 30.0–36.0)
MCV: 100.5 fL — ABNORMAL HIGH (ref 78.0–100.0)
Platelets: 172 10*3/uL (ref 150–400)
RBC: 2.17 MIL/uL — AB (ref 4.22–5.81)
RDW: 13.7 % (ref 11.5–15.5)
WBC: 5.9 10*3/uL (ref 4.0–10.5)

## 2017-12-17 LAB — URINE CULTURE: CULTURE: NO GROWTH

## 2017-12-17 LAB — CHLORIDE, URINE, RANDOM: Chloride Urine: 20 mmol/L

## 2017-12-17 LAB — TROPONIN I: Troponin I: <0.03 ng/mL (ref <0.03)

## 2017-12-17 LAB — SODIUM, URINE, RANDOM: SODIUM UR: 12 mmol/L

## 2017-12-17 LAB — MRSA PCR SCREENING: MRSA BY PCR: NEGATIVE

## 2017-12-17 LAB — OSMOLALITY: Osmolality: 259 mOsm/kg — ABNORMAL LOW (ref 275–295)

## 2017-12-17 LAB — MAGNESIUM: MAGNESIUM: 1.6 mg/dL — AB (ref 1.7–2.4)

## 2017-12-17 LAB — HIV ANTIBODY (ROUTINE TESTING W REFLEX): HIV SCREEN 4TH GENERATION: NONREACTIVE

## 2017-12-17 LAB — OSMOLALITY, URINE: Osmolality, Ur: 588 mOsm/kg (ref 300–900)

## 2017-12-17 MED ORDER — POTASSIUM CHLORIDE CRYS ER 20 MEQ PO TBCR
40.0000 meq | EXTENDED_RELEASE_TABLET | Freq: Once | ORAL | Status: AC
  Administered 2017-12-17: 40 meq via ORAL
  Filled 2017-12-17: qty 2

## 2017-12-17 MED ORDER — CHLORHEXIDINE GLUCONATE 0.12 % MT SOLN
15.0000 mL | Freq: Two times a day (BID) | OROMUCOSAL | Status: DC
  Administered 2017-12-17 – 2017-12-22 (×10): 15 mL via OROMUCOSAL
  Filled 2017-12-17 (×10): qty 15

## 2017-12-17 MED ORDER — LEVOTHYROXINE SODIUM 100 MCG PO TABS
100.0000 ug | ORAL_TABLET | Freq: Every day | ORAL | Status: DC
  Administered 2017-12-17 – 2017-12-22 (×7): 100 ug via ORAL
  Filled 2017-12-17 (×7): qty 1

## 2017-12-17 MED ORDER — FUROSEMIDE 10 MG/ML IJ SOLN: 40.0000 mg | Freq: Once | INTRAMUSCULAR | Status: DC

## 2017-12-17 MED ORDER — AMIODARONE HCL IN DEXTROSE 360-4.14 MG/200ML-% IV SOLN: 30.0000 mg/h | INTRAVENOUS | Status: DC

## 2017-12-17 MED ORDER — INFLUENZA VAC SPLIT QUAD 0.5 ML IM SUSY
0.5000 mL | PREFILLED_SYRINGE | INTRAMUSCULAR | Status: AC
Start: 2017-12-18 — End: 2017-12-22
  Administered 2017-12-22: 0.5 mL via INTRAMUSCULAR
  Filled 2017-12-17: qty 0.5

## 2017-12-17 MED ORDER — ORAL CARE MOUTH RINSE
15.0000 mL | Freq: Two times a day (BID) | OROMUCOSAL | Status: DC
  Administered 2017-12-17 – 2017-12-19 (×5): 15 mL via OROMUCOSAL

## 2017-12-17 MED ORDER — NICOTINE 14 MG/24HR TD PT24
14.0000 mg | MEDICATED_PATCH | Freq: Every day | TRANSDERMAL | Status: DC
  Administered 2017-12-17 – 2017-12-22 (×6): 14 mg via TRANSDERMAL
  Filled 2017-12-17 (×6): qty 1

## 2017-12-17 MED ORDER — PNEUMOCOCCAL VAC POLYVALENT 25 MCG/0.5ML IJ INJ
0.5000 mL | INJECTION | INTRAMUSCULAR | Status: AC
  Administered 2017-12-22: 0.5 mL via INTRAMUSCULAR
  Filled 2017-12-17: qty 0.5

## 2017-12-17 MED ORDER — SODIUM CHLORIDE 0.9 % IV BOLUS (SEPSIS)
1000.0000 mL | Freq: Once | INTRAVENOUS | Status: AC
  Administered 2017-12-17: 1000 mL via INTRAVENOUS

## 2017-12-17 MED ORDER — AMIODARONE LOAD VIA INFUSION
150.0000 mg | Freq: Once | INTRAVENOUS | Status: DC
  Filled 2017-12-17: qty 83.34

## 2017-12-17 MED ORDER — AMIODARONE HCL IN DEXTROSE 360-4.14 MG/200ML-% IV SOLN: 60.0000 mg/h | INTRAVENOUS | Status: DC

## 2017-12-17 NOTE — Progress Notes (Signed)
   12/17/17 1100  Clinical Encounter Type  Visited With Patient  Visit Type Initial  Referral From Nurse  Consult/Referral To Chaplain  Spiritual Encounters  Spiritual Needs Emotional  Stress Factors  Patient Stress Factors Lack of knowledge     Patient was in his bed awake and seemingly not very alert. No family members present and patient not able to tell more on this.I introduced myself and noticed that patient had a very slurred speech. Charge nurse came in and confirmed that patient might not be in good position to discuss more. I provided compassionate presence.  Yarisa Lynam a [place]

## 2017-12-17 NOTE — Progress Notes (Signed)
[facility]. Waiting on other morning labs to come back.

## 2017-12-17 NOTE — Progress Notes (Signed)
PULMONARY / CRITICAL CARE MEDICINE   Name: Bruce Simpson MRN: [id number] DOB: [birth year]    ADMISSION DATE:  12/16/2017 CONSULTATION DATE:  12/16/17  REFERRING MD:  Dr. Julieanne Manson / EDP   CHIEF COMPLAINT:  Altered mental status, hypothermia, hypotension   HISTORY OF PRESENT ILLNESS:   52 y/o M who presented to [facility] on 1/8 via EMS after being found behind a liquor store this am.  His friend called EMS because he was "acting differently".    Reportedly the patient had three large beers to drink the evening prior.  EMS reported urinary urgency and a fall on 1/7 pm.  His was initially noted to have hypotension (80's systolic), bradycardia and hypothermia (rectal temp 83). The patient reports he takes medicine for his thyroid but can not state what or when he last took them.    Initial labs- Na 119, K 3.0, cl 84, CO2 17, glucose 107, BUN 6 / sr cr 0.71, AG 18, Mg 1.8, albumin 3.1, lipase 82, AST 246, ALT 52, lactic acid 6.48 > cleared to 1.89, WBC 5, Hgb 8.4, platelets 157.  ETOH <10, salicylate <7, TSH 17.428, T4 <0.25.  CXR with changes consistent with emphysema / scarring but no overt infiltrate.  CT head negative for acute process.  As patient was re-warmed, mental status improved.    PCCM called for ICU admit.   REVIEW OF SYSTEMS:   Unable to complete with patient due to AMS  SUBJECTIVE:  As above  VITAL SIGNS: BP (!) 80/62   Pulse 60   Temp (!) 97.5 F (36.4 C) (Axillary)   Resp 13   Ht 6\' 1"  (1.854 m)   Wt 200 lb (90.7 kg)   SpO2 96%   BMI 26.39 kg/m   HEMODYNAMICS:    VENTILATOR SETTINGS:    INTAKE / OUTPUT: I/O last 3 completed shifts: In: 8075 [I.V.:2925; IV Piggyback:5150] Out: 1825 [Urine:1825]  PHYSICAL EXAMINATION: General: chronically ill appearing male in NAD, lying flat in his bed, alert today HEENT: MM moist, PERRL, EOM intact Neuro: Alert and oriented, responds appropriately, reflexes +2 CV: S1-S2 clear to ascultation, RRR this am PULM: even/non-labored,  lungs bilaterally rhonchus today NW:GNFA, non-tender, bsx4 active  Extremities: cool to touch, generalized edema decreased today Skin: no rashes or lesions. Multiple tattoos.   LABS:  BMET Recent Labs  Lab 12/16/17 1912 12/17/17 0006 12/17/17 0353  NA 123* 123* 127*  K 3.6 3.6 3.6  CL 95* 94* 100*  CO2 21* 20* 21*  BUN <5* 7 5*  CREATININE 0.81 0.82 0.93  GLUCOSE 57* 45* 87    Electrolytes Recent Labs  Lab 12/16/17 0724  12/16/17 1912 12/17/17 0006 12/17/17 0353  CALCIUM 8.5*   < > 7.6* 7.6* 7.7*  MG 1.8  --   --   --  1.6*  PHOS  --   --   --   --  3.5   < > = values in this interval not displayed.   CBC Recent Labs  Lab 12/16/17 0724 12/17/17 0353  WBC 5.0 5.9  HGB 8.4* 7.5*  HCT 23.6* 21.8*  PLT 157 172   Coag's Recent Labs  Lab 12/16/17 0724  INR 0.91   Sepsis Markers Recent Labs  Lab 12/16/17 0740 12/16/17 0954  LATICACIDVEN 6.48* 1.89   ABG Recent Labs  Lab 12/16/17 1158  PHART 7.424  PCO2ART 29.7*  PO2ART 36.0*   Liver Enzymes Recent Labs  Lab 12/16/17 0724 12/17/17 0353  AST 246* 189*  ALT 52 44  ALKPHOS 78 62  BILITOT 0.7 0.8  ALBUMIN 3.1* 2.5*   Cardiac Enzymes Recent Labs  Lab 12/16/17 1535 12/16/17 1912 12/17/17 0353  TROPONINI <0.03 <0.03 <0.03   Glucose Recent Labs  Lab 12/16/17 1949 12/16/17 2325 12/17/17 0340  GLUCAP 61* 133* 90   Imaging Ct Head Wo Contrast  Result Date: 12/16/2017 CLINICAL DATA:  Unwitnessed fall with altered level of consciousness EXAM: CT HEAD WITHOUT CONTRAST TECHNIQUE: Contiguous axial images were obtained from the base of the skull through the vertex without intravenous contrast. COMPARISON:  None. FINDINGS: Brain: There is mild atrophy for age. There is prominence of the cisterna magna, a presumed anatomic variant. There is no appreciable intracranial mass, hemorrhage, extra-axial fluid collection, or midline shift. There is minimal small vessel disease in the centra semiovale  bilaterally. Elsewhere gray-white compartments appear normal. No evident acute infarct. Vascular: No hyperdense vessel. There is mild calcification in the right carotid siphon. Skull: Bony calvarium appears intact. Sinuses/Orbits: There is slight mucosal thickening in several ethmoid air cells. There is a rather minimal retention cyst in the medial left maxillary antrum. Orbits appear symmetric bilaterally. Other: Mastoid air cells appear clear. IMPRESSION: Mild atrophy for age. Minimal periventricular small vessel disease. No mass, hemorrhage, or acute infarct. Mild arterial vascular calcification.  Mild paranasal sinus disease. Electronically Signed   By: Bretta BangWilliam  Woodruff III M.D.   On: 12/16/2017 09:20   Dg Chest Portable 1 View  Result Date: 12/16/2017 CLINICAL DATA:  Altered mental status and weakness EXAM: PORTABLE CHEST 1 VIEW COMPARISON:  None. FINDINGS: Cardiac shadow is within normal limits. Lungs are well aerated bilaterally. Emphysematous changes are noted concentrated in the right upper lobe. No focal infiltrate or sizable effusion is seen. No bony abnormality is noted. IMPRESSION: COPD without acute abnormality. Electronically Signed   By: Alcide CleverMark  Lukens M.D.   On: 12/16/2017 10:28   STUDIES:  Cortisol 1/8 >> 15.1 UDS 1/8 >> positive for marijuana  TSH 1/8 >> 17.428  T4 1/8 >> less than 0.25 ABG 1/8>>>7.424/29.7/36.0/20.6 UA 1/8 >>> 20 ketones, trace leukocytes, no bacteria, 6-30 WBC LA 6.48 down to 1.89.  CULTURES: BCx2 1/8 >>> no growth to date UC 1/8 >>> no growth to date  ANTIBIOTICS: Vanco 1/8 >>>  Zosyn 1/8 >>>   SIGNIFICANT EVENTS: 1/08  Admit with AMS, hypothermia, elevated TSH, bradycardia initially  LINES/TUBES: PIV x 2  DISCUSSION: 52 y/o M [facility] with AMS, hypothermia, bradycardia, hyponatremia. He responded well to initial fluid resuscitation. He has a history of hypothyroidism as per patient.   ASSESSMENT / PLAN:  PULMONARY A: At Risk  for aspiration - in setting of AMS CXR clear and stable again this am P:   Patient saturating well on nasal cannula  Has not required advanced support Albuterol nebs q2 hours PRN CXR clear, no acute process noted. Consistent with COPD enlargement of the lung fields.   CARDIOVASCULAR A:  Bradycardia- improved Hypotension- stable not improving  Hypothermia-improved with minimal treatment P:  Monitor blood pressure and vitals continuously  Continue fluid rehydration at 13050ml/hr of normal saline Received ~9.5 liters fluid total to date   RENAL A:   Hyponatremia as below Hypokalemia improved Lactic Acidosis - cleared  No AKI noted, Serum Cr-0.71 P:   Replaced K+ with 60mEq of IV potassium in ED One dose of Kdur PO 40mEq BMET's q6 hours to monitor Na, goal of increase of 246mmol/hr max for 24 hour period  GASTROINTESTINAL A:   Elevated AST to 189, ALT 44-improving  P:   Monitor with hepatic function panel in AM  HEMATOLOGIC A:   Anemia unknown if this is acute/chronic at this time.  8.4 on 1/8, Transfusion goal of Hgb 7 given lack of additional risk factors.  Hgb 7.5 on 1/9, most likely delusional P:  Transfuse at Hgb 7 Will continue to monitor w/ CBC in am No active signs of blood loss, continue to monitor for blood loss  INFECTIOUS A:   R/O Infectious etiology hypothermia/hypotension. WBC 5.0 No clear etiology for infection at this time.  Will continue to evaluate pending culture results  P:   Culture results as above Continue Vancomycin and Zosyn, consider discontinuing after 48 hours of treatment LA trended down from 6.48-1.89  ENDOCRINE A:   Hx Hypothyroidism - pt reports taking medication but can't state when / how much  R/O Myxedema coma Patient presented found down, hypothermic, bradycardic, hypotensive, hyponatremic, encephalopathy-this could be explained by being found down outside in cold weather as well   P:   Monitor CBG Q4 hours Hydrocortisone  100mg  q8 hours, consider discontinuing at this time  NEUROLOGIC A:   Acute Encephalopathy  ETOH Abuse history, CIWA? If indicated Acetaminophen 10.0 and Salicylate <7.0 Ammonia 17 P:   RASS goal: Not currently indicated Continue to monitor mental status for improvement  FAMILY  - Updates:   - Inter-disciplinary family meet or Palliative Care meeting due by:  Day 7  Lanelle Bal, MD  Pulmonary and Critical Care Medicine [facility] Pager: [phone]  12/17/2017, 7:06 AM

## 2017-12-17 NOTE — Progress Notes (Signed)
Pt admitted to room 3e25. Pt alert and oriented, initial o2 sats in the 80's. O2 sensor placed on forehead and reading is now 96% on room air. Will continue to monitor pt's respiratory status.

## 2017-12-17 NOTE — Progress Notes (Signed)
RT NOTE:  Pt refuses CPAP therapy tonight. Pt wore last night, however, there was no CPAP located in patients room when RT arrived.

## 2017-12-17 NOTE — Plan of Care (Signed)
Pt. Denies pain at this time. Call light placed within reach. Pt. Encouraged to call staff for assistance when needed.

## 2017-12-17 NOTE — Progress Notes (Signed)
Pt refused morning bath.

## 2017-12-18 LAB — BASIC METABOLIC PANEL
ANION GAP: 7 (ref 5–15)
ANION GAP: 7 (ref 5–15)
Anion gap: 4 — ABNORMAL LOW (ref 5–15)
BUN: 6 mg/dL (ref 6–20)
BUN: 5 mg/dL — ABNORMAL LOW (ref 6–20)
BUN: 6 mg/dL (ref 6–20)
CALCIUM: 8.3 mg/dL — AB (ref 8.9–10.3)
CALCIUM: 8.5 mg/dL — AB (ref 8.9–10.3)
CHLORIDE: 103 mmol/L (ref 101–111)
CHLORIDE: 104 mmol/L (ref 101–111)
CO2: 22 mmol/L (ref 22–32)
CO2: 20 mmol/L — AB (ref 22–32)
CO2: 20 mmol/L — AB (ref 22–32)
Calcium: 8.2 mg/dL — ABNORMAL LOW (ref 8.9–10.3)
Chloride: 105 mmol/L (ref 101–111)
Creatinine, Ser: 1.07 mg/dL (ref 0.61–1.24)
Creatinine, Ser: 1.06 mg/dL (ref 0.61–1.24)
Creatinine, Ser: 1.27 mg/dL — ABNORMAL HIGH (ref 0.61–1.24)
GFR calc Af Amer: >60 mL/min (ref >60)
GFR calc Af Amer: >60 mL/min (ref >60)
GFR calc non Af Amer: >60 mL/min (ref >60)
GFR calc non Af Amer: >60 mL/min (ref >60)
GLUCOSE: 112 mg/dL — AB (ref 65–99)
GLUCOSE: 99 mg/dL (ref 65–99)
GLUCOSE: 85 mg/dL (ref 65–99)
POTASSIUM: 4 mmol/L (ref 3.5–5.1)
POTASSIUM: 3.4 mmol/L — AB (ref 3.5–5.1)
Potassium: 3.3 mmol/L — ABNORMAL LOW (ref 3.5–5.1)
Sodium: 131 mmol/L — ABNORMAL LOW (ref 135–145)
Sodium: 130 mmol/L — ABNORMAL LOW (ref 135–145)
Sodium: 131 mmol/L — ABNORMAL LOW (ref 135–145)

## 2017-12-18 LAB — GLUCOSE, CAPILLARY
GLUCOSE-CAPILLARY: 120 mg/dL — AB (ref 65–99)
Glucose-Capillary: 106 mg/dL — ABNORMAL HIGH (ref 65–99)
Glucose-Capillary: 107 mg/dL — ABNORMAL HIGH (ref 65–99)
Glucose-Capillary: 117 mg/dL — ABNORMAL HIGH (ref 65–99)
Glucose-Capillary: 110 mg/dL — ABNORMAL HIGH (ref 65–99)
Glucose-Capillary: 192 mg/dL — ABNORMAL HIGH (ref 65–99)

## 2017-12-18 LAB — MAGNESIUM: Magnesium: 1.8 mg/dL (ref 1.7–2.4)

## 2017-12-18 MED ORDER — POTASSIUM CHLORIDE CRYS ER 20 MEQ PO TBCR
40.0000 meq | EXTENDED_RELEASE_TABLET | Freq: Once | ORAL | Status: AC
  Administered 2017-12-18: 40 meq via ORAL
  Filled 2017-12-18: qty 2

## 2017-12-18 NOTE — Plan of Care (Signed)
  Education: Knowledge of General Education information will improve 12/18/2017 1206 - Not Progressing by Loel LoftyLewis, Tegan Britain P, RN

## 2017-12-18 NOTE — Progress Notes (Signed)
PROGRESS NOTE  Bruce Simpson ZOX:[id number] DOB: [birth year] DOA: 12/16/2017 PCP: Patient, No Pcp Per  HPI/Recap of past 12 hours: 52 year old male who is homeless with history of alcohol/tobacco abuse, hypothyroidism, found unresponsive, hypotensive, hypothermic, bradycardic outside a liquor store PTA to our ER. In the ER, pt was altered, noted to have a sodium of 119, TSH of 17, LA 6.4, negative alcohol level. Pt was admitted to the ICU where he was managed for possible myxedema coma (pt reported not taking his synthroid for a while). Pt was resucitated with 6L of NS, started on IV synthroid and IV hydrocortisone. Pt was stabilized and ICU transferred services to [facility] starting 12/18/17.  Today, pt reported feeling better overall, still reports some generalized weakness. Denies any chest pain, SOB, abdominal pain, fever/chills, dizziness.   Assessment/Plan: Active Problems:   Hypotension   Hypothermia   Hyponatremia   Myxedema coma (HCC)  Acute metabolic encephalopathy Likely due to Myxedema coma Improving, now AAO Likely due to non-compliance to synthroid TSH on admission 17, T4 <0.25, cortisol level 15.1 CT head: Mild atrophy for age. Minimal periventricular small vessel disease UDS pos for marijuana Continue PO synthroid, IV hydrocortisone  Hypotension BP still soft, SBP in the 80s, not toxic looking, no signs of sepsis Likely due to above Continue IV hydrocortisone, IVF  Hypothermia/bradycardia Resolved Likely due to cold exposure, keep warm  Hyponatremia Improving s/p IVF 119 on admission-->130 Hx of chronic alcohol abuse ??alcohol induced + intravascular vol depletion Daily BMP, continue gentle hydration  Lactic acidosis Resolved s/p IVF No signs of sepsis CXR: Hyperinflation consistent with COPD, particularly bullous change at the RIGHT apex UA: neg, UC no growth BC X 2, NGTD D/C IV Vanc + IV Zosyn, no signs of infection   Macrocytic  anemia Hgb 7.5, hemodilution contributing as pt received massive amounts of IVF No signs of bleeding Folate, Vit B12 levels pending Monitor closely, daily cbc  Undiagnosed COPD Currently stable, smokes about 1.5 PPD, not on O2 CXR as above  Tobacco abuse Counseled on cessation Nicotine patch  Alcohol abuse Drinks about 10 beers daily + Vodka AST 189, ALT 44 Counseled on cessation If signs of withdrawal, start CIWA protocol. Pt reports no hx of withdrawal   Code Status: Full  Family Communication: None at bedside  Disposition Plan: Homeless, SW on board, likely shelter   Consultants:  PCCM  Procedures:  None  Antimicrobials:  S/P IV Vanc + Zosyn  DVT prophylaxis:  Kirvin Heparin   Objective: Vitals:   12/17/17 1736 12/17/17 1945 12/18/17 0527 12/18/17 0851  BP: (!) 94/45 (!) 91/55 90/67 (!) 88/62  Pulse: 79 76 76 86  Resp: 18 18 18 16   Temp: (!) 97.5 F (36.4 C) (!) 97.2 F (36.2 C) 98 F (36.7 C) 98 F (36.7 C)  TempSrc: Oral Oral Oral Oral  SpO2:  99% 99% 99%  Weight: 100.9 kg (222 lb 7.1 oz)  100.7 kg (222 lb 0.1 oz)   Height: 6\' 1"  (1.854 m)       Intake/Output Summary (Last 24 hours) at 12/18/2017 1259 Last data filed at 12/18/2017 0851 Gross per 24 hour  Intake 2075 ml  Output 890 ml  Net 1185 ml   Filed Weights   12/16/17 0928 12/17/17 1736 12/18/17 0527  Weight: 90.7 kg (200 lb) 100.9 kg (222 lb 7.1 oz) 100.7 kg (222 lb 0.1 oz)    Exam:   General:  AAO, NAD  Cardiovascular: S1,S2 present, no added hrt  sound  Respiratory: Diminished BS B/l  Abdomen: Soft, non-tender, non-distended, BS present  Musculoskeletal: No pedal edema bilaterally  Skin: Normal  Psychiatry: Normal affect    Data Reviewed: CBC: Recent Labs  Lab 12/16/17 0724 12/17/17 0353  WBC 5.0 5.9  NEUTROABS 4.0  --   HGB 8.4* 7.5*  HCT 23.6* 21.8*  MCV 97.9 100.5*  PLT 157 172   Basic Metabolic Panel: Recent Labs  Lab 12/16/17 0724  12/17/17 0353  12/17/17 1400 12/17/17 1917 12/18/17 0228 12/18/17 0718  NA 119*   < > 127* 130* 129* 131* 130*  K 3.0*   < > 3.6 3.4* 3.7 4.0 3.3*  CL 84*   < > 100* 104 101 105 103  CO2 17*   < > 21* 18* 21* 22 20*  GLUCOSE 107*   < > 87 83 115* 112* 99  BUN 6   < > 5* <5* <5* 6 5*  CREATININE 0.71   < > 0.93 1.04 1.10 1.07 1.06  CALCIUM 8.5*   < > 7.7* 8.1* 8.3* 8.2* 8.3*  MG 1.8  --  1.6*  --   --  1.8  --   PHOS  --   --  3.5  --   --   --   --    < > = values in this interval not displayed.   GFR: Estimated Creatinine Clearance: 105.2 mL/min (by C-G formula based on SCr of 1.06 mg/dL). Liver Function Tests: Recent Labs  Lab 12/16/17 0724 12/17/17 0353  AST 246* 189*  ALT 52 44  ALKPHOS 78 62  BILITOT 0.7 0.8  PROT 5.7* 4.7*  ALBUMIN 3.1* 2.5*   Recent Labs  Lab 12/16/17 0724  LIPASE 82*   Recent Labs  Lab 12/16/17 0724  AMMONIA 17   Coagulation Profile: Recent Labs  Lab 12/16/17 0724  INR 0.91   Cardiac Enzymes: Recent Labs  Lab 12/16/17 1535 12/16/17 1912 12/17/17 0353  TROPONINI <0.03 <0.03 <0.03   BNP (last 3 results) No results for input(s): PROBNP in the last 8760 hours. HbA1C: No results for input(s): HGBA1C in the last 72 hours. CBG: Recent Labs  Lab 12/17/17 1553 12/17/17 2259 12/18/17 0417 12/18/17 0743 12/18/17 1214  GLUCAP 115* 115* 106* 107* 117*   Lipid Profile: No results for input(s): CHOL, HDL, LDLCALC, TRIG, CHOLHDL, LDLDIRECT in the last 72 hours. Thyroid Function Tests: Recent Labs    12/16/17 1047 12/16/17 1215  TSH 17.428*  --   FREET4  --  <0.25*   Anemia Panel: No results for input(s): VITAMINB12, FOLATE, FERRITIN, TIBC, IRON, RETICCTPCT in the last 72 hours. Urine analysis:    Component Value Date/Time   COLORURINE AMBER (A) 12/16/2017 1026   APPEARANCEUR HAZY (A) 12/16/2017 1026   LABSPEC 1.024 12/16/2017 1026   PHURINE 5.0 12/16/2017 1026   GLUCOSEU NEGATIVE 12/16/2017 1026   HGBUR NEGATIVE 12/16/2017 1026    BILIRUBINUR SMALL (A) 12/16/2017 1026   KETONESUR 20 (A) 12/16/2017 1026   PROTEINUR NEGATIVE 12/16/2017 1026   NITRITE NEGATIVE 12/16/2017 1026   LEUKOCYTESUR TRACE (A) 12/16/2017 1026   Sepsis Labs: @LABRCNTIP (procalcitonin:4,lacticidven:4)  ) Recent Results (from the past 240 hour(s))  Blood Culture (routine x 2)     Status: None (Preliminary result)   Collection Time: 12/16/17  8:00 AM  Result Value Ref Range Status   Specimen Description BLOOD RIGHT ANTECUBITAL  Final   Special Requests   Final    BOTTLES DRAWN AEROBIC AND ANAEROBIC Blood Culture adequate  volume   Culture NO GROWTH 1 DAY  Final   Report Status PENDING  Incomplete  Blood Culture (routine x 2)     Status: None (Preliminary result)   Collection Time: 12/16/17  8:10 AM  Result Value Ref Range Status   Specimen Description BLOOD RIGHT FOREARM  Final   Special Requests   Final    BOTTLES DRAWN AEROBIC AND ANAEROBIC Blood Culture adequate volume   Culture NO GROWTH 1 DAY  Final   Report Status PENDING  Incomplete  Urine culture     Status: None   Collection Time: 12/16/17 10:26 AM  Result Value Ref Range Status   Specimen Description URINE, CATHETERIZED  Final   Special Requests NONE  Final   Culture NO GROWTH  Final   Report Status 12/17/2017 FINAL  Final  MRSA PCR Screening     Status: None   Collection Time: 12/16/17  6:28 PM  Result Value Ref Range Status   MRSA by PCR NEGATIVE NEGATIVE Final    Comment:        The GeneXpert MRSA Assay (FDA approved for NASAL specimens only), is one component of a comprehensive MRSA colonization surveillance program. It is not intended to diagnose MRSA infection nor to guide or monitor treatment for MRSA infections.       Studies: No results found.  Scheduled Meds: . chlorhexidine  15 mL Mouth Rinse BID  . heparin  5,000 Units Subcutaneous Q8H  . hydrocortisone sod succinate (SOLU-CORTEF) inj  100 mg Intravenous Q8H  . Influenza vac split quadrivalent PF   0.5 mL Intramuscular Tomorrow-1000  . levothyroxine  100 mcg Oral QAC breakfast  . mouth rinse  15 mL Mouth Rinse q12n4p  . nicotine  14 mg Transdermal Daily  . pneumococcal 23 valent vaccine  0.5 mL Intramuscular Tomorrow-1000    Continuous Infusions: . sodium chloride    . sodium chloride 150 mL/hr at 12/17/17 0552  . piperacillin-tazobactam (ZOSYN)  IV Stopped (12/18/17 1032)  . vancomycin Stopped (12/18/17 1216)     LOS: 2 days     Briant Cedar, MD [facility]   If 7PM-7AM, please contact night-coverage [email or web address] Password [facility] 12/18/2017, 12:59 PM

## 2017-12-18 NOTE — Care Management Note (Addendum)
Case Management Note  Patient Details  Name: Elana AlmBrian Edwin Allcock MRN: [id number] Date of Birth: [birth year]  Subjective/Objective:   Admitted with hypotension.              Action/Plan: In to speak with patient, Friend "lisa" [phone] at bedside.  Verbal permission given to speak with Friend "Misty StanleyLisa" present.  Prior to admission [place] [address]  Patient has no insurance and no income; homeless, provided address for [facility] at [address]; [place], [place] [address].  Patient states [facility].  Patient admits that he has been drinking alcohol since the age of 52. Only been sober during the time of incarceration for 13 years.  Has been out of prison for around 5 years. Patient states he has been in the [place] area for 2 years. Patient stated he would like a half way house versus shelter if possible.  Unable to provide a Pharmacy that he has uses.  Patient admits to inability to afford food/medications and no transportation.   Patient agreed for us to assist with scheduling [facility] for [facility] prior to discharge.  Referral received for DME: [facility],  In to speak with Pt regarding recommendation for DME: [facility]. [facility].  [facility] and arranged to be delivered to room prior to discharge.  NCM will continue to follow for discharge needs.  Post Acute Care Choice:  Durable Medical Equipment Choice offered to:  Patient  DME Arranged:  Gilmer Morane DME Agency:  [facility]  Status of Service:  In process, will continue to follow  Yancey FlemingsKimberly R Analeese Andreatta, RN  Nurse Case Manager-orientation [phone] 12/18/2017, 10:57 AM

## 2017-12-18 NOTE — Progress Notes (Signed)
Pt. Without urine post foley removal. Bladder scan completed with 200cc noted. RN will continue to monitor.

## 2017-12-18 NOTE — Progress Notes (Signed)
Bladder scan performed on pt resulting 377cc.  Pt has not voided since 0620.  Pt states he has no urge to void.   I/O cath performed resulting 400cc.   MD notified.

## 2017-12-18 NOTE — Evaluation (Signed)
Physical Therapy Evaluation Patient Details Name: Bruce Simpson MRN: [id number] DOB: 1966/10/10 Today's Date: 12/18/2017   History of Present Illness  Pt is a 52 y.o. male who [facility]17 after being found unresponsive, hypotensive, and hypothermic outside; responded well to initial fluid resuscitation. PMH includes ETOH use, hypothyroidism (per pt), depression.     Clinical Impression  Pt presents with instability and an overall decrease in functional mobility secondary to above. PTA, pt homeless and indep with mobility; notes increased instability walking over the fast few days, but unsure specific onset or cause. Today, required close min guard for balance while walking; no ataxia or weakness noted upon exam. Stability improved with use of SPC, but pt with some difficulty sequencing with this. Pt interested in speaking with Social Worker about options for assist/shelters upon d/c from hospital. Pt would benefit from continued acute PT services to maximize functional mobility and independence prior to d/c.     Follow Up Recommendations No PT follow up    Equipment Recommendations  Cane    Recommendations for Other Services Other (comment)(Social Worker)     Precautions / Restrictions Precautions Precautions: Fall Restrictions Weight Bearing Restrictions: No      Mobility  Bed Mobility Overal bed mobility: Independent                Transfers Overall transfer level: Needs assistance Equipment used: None;Straight cane Transfers: Sit to/from Stand Sit to Stand: Min guard         General transfer comment: Pt with instability standing, but able to self-correct; min guard for balance  Ambulation/Gait Ambulation/Gait assistance: Min guard Ambulation Distance (Feet): 250 Feet Assistive device: 1 person hand held assist;Straight cane Gait Pattern/deviations: Step-through pattern;Decreased stride length;Staggering right;Staggering left Gait  velocity: Decreased Gait velocity interpretation: <1.8 ft/sec, indicative of risk for recurrent falls General Gait Details: Slow, uncontrolled amb with single HHA. Initiated gait training with SPC in R-hand, with stability somewhat improving although pt has difficulty sequencing; continues to require min guard for balance although no overt LOB  Stairs            Wheelchair Mobility    Modified Rankin (Stroke Patients Only)       Balance Overall balance assessment: Needs assistance   Sitting balance-Leahy Scale: Good       Standing balance-Leahy Scale: Fair                               Pertinent Vitals/Pain Pain Assessment: Faces Faces Pain Scale: Hurts a little bit Pain Location: Generalized stiffness Pain Descriptors / Indicators: Sore Pain Intervention(s): Monitored during session;Limited activity within patient's tolerance    Home Living Family/patient expects to be discharged to:: Shelter/Homeless                 Additional Comments: Pt states he does not have anywhere to stay and is not interested in a shelter. Willing to speak with SW about options    Prior Function Level of Independence: Independent         Comments: Has noted increased instability over the past week, but cannot recall any change in routine or health      Hand Dominance        Extremity/Trunk Assessment   Upper Extremity Assessment Upper Extremity Assessment: Overall WFL for tasks assessed    Lower Extremity Assessment Lower Extremity Assessment: Overall WFL for tasks assessed;RLE deficits/detail;LLE deficits/detail RLE  Deficits / Details: 5/5 LLE Deficits / Details: 5/5       Communication   Communication: No difficulties  Cognition Arousal/Alertness: Awake/alert Behavior During Therapy: WFL for tasks assessed/performed Overall Cognitive Status: Within Functional Limits for tasks assessed                                         General Comments      Exercises     Assessment/Plan    PT Assessment Patient needs continued PT services  PT Problem List Decreased balance;Decreased mobility;Decreased knowledge of use of DME       PT Treatment Interventions DME instruction;Gait training;Stair training;Functional mobility training;Therapeutic activities;Therapeutic exercise;Balance training;Patient/family education    PT Goals (Current goals can be found in the Care Plan section)  Acute Rehab PT Goals Patient Stated Goal: Decreased stiffness and able to walk better PT Goal Formulation: With patient Time For Goal Achievement: 01/01/18 Potential to Achieve Goals: Good    Frequency Min 3X/week   Barriers to discharge Inaccessible home environment;Decreased caregiver support Homeless    Co-evaluation               AM-PAC PT "6 Clicks" Daily Activity  Outcome Measure Difficulty turning over in bed (including adjusting bedclothes, sheets and blankets)?: None Difficulty moving from lying on back to sitting on the side of the bed? : None Difficulty sitting down on and standing up from a chair with arms (e.g., wheelchair, bedside commode, etc,.)?: A Little Help needed moving to and from a bed to chair (including a wheelchair)?: A Little Help needed walking in hospital room?: A Little Help needed climbing 3-5 steps with a railing? : A Little 6 Click Score: 20    End of Session Equipment Utilized During Treatment: Gait belt Activity Tolerance: Patient tolerated treatment well Patient left: in bed;with call bell/phone within reach Nurse Communication: Mobility status PT Visit Diagnosis: Other abnormalities of gait and mobility (R26.89)    Time: 1610-9604 PT Time Calculation (min) (ACUTE ONLY): 30 min   Charges:   PT Evaluation $PT Eval Moderate Complexity: 1 Mod PT Treatments $Gait Training: 8-22 mins   PT G Codes:       Ina Homes, PT, DPT Acute Rehab Services  Pager:  [phone]  Malachy Chamber 12/18/2017, 10:40 AM

## 2017-12-19 ENCOUNTER — Inpatient Hospital Stay (HOSPITAL_COMMUNITY): Payer: Self-pay

## 2017-12-19 LAB — GLUCOSE, CAPILLARY
GLUCOSE-CAPILLARY: 119 mg/dL — AB (ref 65–99)
GLUCOSE-CAPILLARY: 116 mg/dL — AB (ref 65–99)
GLUCOSE-CAPILLARY: 108 mg/dL — AB (ref 65–99)
GLUCOSE-CAPILLARY: 100 mg/dL — AB (ref 65–99)
Glucose-Capillary: 118 mg/dL — ABNORMAL HIGH (ref 65–99)

## 2017-12-19 LAB — BASIC METABOLIC PANEL
Anion gap: 6 (ref 5–15)
BUN: 9 mg/dL (ref 6–20)
CHLORIDE: 106 mmol/L (ref 101–111)
CO2: 19 mmol/L — ABNORMAL LOW (ref 22–32)
Calcium: 8.6 mg/dL — ABNORMAL LOW (ref 8.9–10.3)
Creatinine, Ser: 1.08 mg/dL (ref 0.61–1.24)
Glucose, Bld: 116 mg/dL — ABNORMAL HIGH (ref 65–99)
POTASSIUM: 3.5 mmol/L (ref 3.5–5.1)
SODIUM: 131 mmol/L — AB (ref 135–145)

## 2017-12-19 LAB — CBC WITH DIFFERENTIAL/PLATELET
BASOS ABS: 0 10*3/uL (ref 0.0–0.1)
BASOS PCT: 0 %
EOS ABS: 0 10*3/uL (ref 0.0–0.7)
EOS PCT: 0 %
HCT: 20.6 % — ABNORMAL LOW (ref 39.0–52.0)
Hemoglobin: 7.1 g/dL — ABNORMAL LOW (ref 13.0–17.0)
Lymphocytes Relative: 10 %
Lymphs Abs: 1.1 10*3/uL (ref 0.7–4.0)
MCH: 35.5 pg — ABNORMAL HIGH (ref 26.0–34.0)
MCHC: 34.5 g/dL (ref 30.0–36.0)
MCV: 103 fL — ABNORMAL HIGH (ref 78.0–100.0)
Monocytes Absolute: 0.5 10*3/uL (ref 0.1–1.0)
Monocytes Relative: 5 %
NEUTROS PCT: 85 %
Neutro Abs: 8.6 10*3/uL — ABNORMAL HIGH (ref 1.7–7.7)
PLATELETS: 141 10*3/uL — AB (ref 150–400)
RBC: 2 MIL/uL — AB (ref 4.22–5.81)
RDW: 14.7 % (ref 11.5–15.5)
WBC: 10.2 10*3/uL (ref 4.0–10.5)

## 2017-12-19 LAB — IRON AND TIBC
Iron: 119 ug/dL (ref 45–182)
SATURATION RATIOS: 43 % — AB (ref 17.9–39.5)
TIBC: 280 ug/dL (ref 250–450)
UIBC: 161 ug/dL

## 2017-12-19 LAB — ABO/RH: ABO/RH(D): O NEG

## 2017-12-19 LAB — VITAMIN B12: VITAMIN B 12: 2413 pg/mL — AB (ref 180–914)

## 2017-12-19 LAB — FERRITIN: Ferritin: 128 ng/mL (ref 24–336)

## 2017-12-19 MED ORDER — SENNOSIDES-DOCUSATE SODIUM 8.6-50 MG PO TABS
1.0000 | ORAL_TABLET | Freq: Two times a day (BID) | ORAL | Status: DC
  Administered 2017-12-19 – 2017-12-22 (×7): 1 via ORAL
  Filled 2017-12-19 (×7): qty 1

## 2017-12-19 MED ORDER — POLYETHYLENE GLYCOL 3350 17 G PO PACK
17.0000 g | PACK | Freq: Every day | ORAL | Status: DC
  Administered 2017-12-19 – 2017-12-22 (×4): 17 g via ORAL
  Filled 2017-12-19 (×4): qty 1

## 2017-12-19 MED ORDER — IPRATROPIUM-ALBUTEROL 0.5-2.5 (3) MG/3ML IN SOLN
3.0000 mL | Freq: Three times a day (TID) | RESPIRATORY_TRACT | Status: DC
  Administered 2017-12-19 – 2017-12-22 (×8): 3 mL via RESPIRATORY_TRACT
  Filled 2017-12-19: qty 30
  Filled 2017-12-19 (×10): qty 3

## 2017-12-19 MED ORDER — IPRATROPIUM-ALBUTEROL 0.5-2.5 (3) MG/3ML IN SOLN
3.0000 mL | Freq: Four times a day (QID) | RESPIRATORY_TRACT | Status: DC
  Administered 2017-12-19: 3 mL via RESPIRATORY_TRACT
  Filled 2017-12-19: qty 3

## 2017-12-19 NOTE — Progress Notes (Signed)
Patient has requested that any all medical information be shared with his emergency contact Alma FriendlyLisa Michaels. Her number is listed in his emergency contacts. He would like for her to updated regarding his care and patient status if she calls or if she is at bedside.

## 2017-12-19 NOTE — Clinical Social Work Note (Signed)
Clinical Social Work Assessment  Patient Details  Name: Marvelle Caudill MRN: [id number] Date of Birth: [birth year]  Date of referral:  12/19/17               Reason for consult:  Housing Concerns/Homelessness                Permission sought to share information with:    Permission granted to share information::  No  Name::        Agency::     Relationship::     Contact Information:     Housing/Transportation Living arrangements for the past 2 months:  Homeless Source of Information:  Patient, Medical Team Patient Interpreter Needed:  None Criminal Activity/Legal Involvement Pertinent to Current Situation/Hospitalization:  No - Comment as needed Significant Relationships:  Friend Lives with:  Self Do you feel safe going back to the place where you live?  No Need for family participation in patient care:  Yes (Comment)  Care giving concerns:  Homelessness.   Social Worker assessment / plan:  CSW met with patient. No supports at bedside. CSW introduced role and inquired about needs. Patient reports he has been homeless for years. Most recently he is living in the woods. [facility] to [facility], get his mail, etc. CSW encouraged him to make an appointment with the case worker there to address needs. CSW provided patient with shelter list, community resources, and food pantry and free meal booklets. CSW encouraged patient to call [facility] daily to check on bed availability. He states that all of his family are deceased. He does have two friends but no one he could stay with. No further concerns. CSW encouraged patient to contact CSW as needed. CSW signing off as social work intervention is no longer needed.  Employment status:  Unemployed Forensic scientist:  Self Pay (Medicaid Pending) PT Recommendations:  Not assessed at this time Information / Referral to community resources:  Shelter, Other (Comment Required)(Community and food resources)  Patient/Family's Response  to care:  Patient agreeable to receiving resources. Patient's friend supportive and involved in patient's care. Patient appreciated social work intervention.  Patient/Family's Understanding of and Emotional Response to Diagnosis, Current Treatment, and Prognosis:  Patient has a good understanding of the reason for admission and social work consult. Patient appears happy with hospital care.  Emotional Assessment Appearance:  Appears stated age Attitude/Demeanor/Rapport:  Engaged, Gracious Affect (typically observed):  Accepting, Appropriate, Calm, Pleasant Orientation:  Oriented to Self, Oriented to Place, Oriented to  Time, Oriented to Situation Alcohol / Substance use:  Never Used Psych involvement (Current and /or in the community):  No (Comment)  Discharge Needs  Concerns to be addressed:  Homelessness Readmission within the last 30 days:  No Current discharge risk:  Homeless Barriers to Discharge:  Continued Medical Work up   Candie Chroman, LCSW 12/19/2017, 11:55 AM

## 2017-12-19 NOTE — Plan of Care (Signed)
  Progressing Clinical Measurements: Ability to maintain clinical measurements within normal limits will improve 12/19/2017 0245 - Progressing by Olive BassFutrell, Kaylub Detienne E, RN Will remain free from infection 12/19/2017 0245 - Progressing by Olive BassFutrell, Elaijah Munoz E, RN Diagnostic test results will improve 12/19/2017 0245 - Progressing by Olive BassFutrell, Hyman Crossan E, RN Respiratory complications will improve 12/19/2017 0245 - Progressing by Olive BassFutrell, Janyah Singleterry E, RN Cardiovascular complication will be avoided 12/19/2017 0245 - Progressing by Olive BassFutrell, Syria Kestner E, RN Activity: Risk for activity intolerance will decrease 12/19/2017 0245 - Progressing by Olive BassFutrell, Anaysha Andre E, RN Nutrition: Adequate nutrition will be maintained 12/19/2017 0245 - Progressing by Olive BassFutrell, Juma Oxley E, RN Coping: Level of anxiety will decrease 12/19/2017 0245 - Progressing by Olive BassFutrell, Leilanie Rauda E, RN Elimination: Will not experience complications related to bowel motility 12/19/2017 0245 - Progressing by Olive BassFutrell, Masiel Gentzler E, RN Will not experience complications related to urinary retention 12/19/2017 0245 - Progressing by Olive BassFutrell, Eber Ferrufino E, RN Safety: Ability to remain free from injury will improve 12/19/2017 0245 - Progressing by Olive BassFutrell, Masahiro Iglesia E, RN Skin Integrity: Risk for impaired skin integrity will decrease 12/19/2017 0245 - Progressing by Olive BassFutrell, Nyomi Howser E, RN

## 2017-12-19 NOTE — Progress Notes (Signed)
PROGRESS NOTE  Elana AlmBrian Edwin Barsamian ZOX:[id number] DOB: 12/31/1965 DOA: 12/16/2017 PCP: Patient, No Pcp Per  HPI/Recap of past 3724 hours: 52 year old male who is homeless with history of alcohol/tobacco abuse, hypothyroidism, found unresponsive, hypotensive, hypothermic, bradycardic outside a liquor store PTA to our ER. In the ER, pt was altered, noted to have a sodium of 119, TSH of 17, LA 6.4, negative alcohol level. Pt was admitted to the ICU where he was managed for possible myxedema coma (pt reported not taking his synthroid for a while). Pt was resucitated with 6L of NS, started on IV synthroid and IV hydrocortisone. Pt was stabilized and ICU transferred services to [facility] starting 12/18/17.  Today, pt reported feeling better overall, still reports some generalized weakness. Denies any chest pain, SOB, abdominal pain, fever/chills, dizziness.   Assessment/Plan: Active Problems:   Hypotension   Hypothermia   Hyponatremia   Myxedema coma (HCC)  Acute metabolic encephalopathy Likely due to Myxedema coma Improving, now AAO Likely due to non-compliance to synthroid TSH on admission 17, T4 <0.25, cortisol level 15.1 CT head: Mild atrophy for age. Minimal periventricular small vessel disease UDS pos for marijuana Continue PO synthroid, IV hydrocortisone  Hypotension Improving BP soft, but improving, not toxic looking, no signs of sepsis Likely due to above Continue IV hydrocortisone, IVF  Hypothermia/bradycardia Resolved Likely due to cold exposure, keep warm  Hyponatremia Improving s/p IVF 119 on admission-->130s Hx of chronic alcohol abuse ??alcohol induced + intravascular vol depletion Daily BMP, continue gentle hydration  Lactic acidosis Resolved s/p IVF No signs of sepsis CXR: Hyperinflation consistent with COPD, particularly bullous change at the RIGHT apex UA: neg, UC no growth BC X 2, NGTD D/C IV Vanc + IV Zosyn, no signs of infection   Macrocytic  anemia Hgb 7.5, hemodilution contributing as pt received massive amounts of IVF No signs of bleeding Vit B12 levels 2,413, Folate pending Iron panel WNL Monitor closely, daily cbc  Undiagnosed COPD Currently stable, smokes about 1.5 PPD, not on O2 CXR as above  Tobacco abuse Counseled on cessation Nicotine patch  Alcohol abuse Drinks about 10 beers daily + Vodka AST 189, ALT 44 Counseled on cessation If signs of withdrawal, start CIWA protocol. Pt reports no hx of withdrawal   Code Status: Full  Family Communication: None at bedside  Disposition Plan: Homeless, SW on board, likely shelter   Consultants:  PCCM  Procedures:  None  Antimicrobials:  S/P IV Vanc + Zosyn  DVT prophylaxis:  [place] Heparin   Objective: Vitals:   12/18/17 2003 12/19/17 0500 12/19/17 1133 12/19/17 1333  BP: 103/69 105/62 98/68   Pulse: 94 79 78 81  Resp: 18 20 18 16   Temp: 97.8 F (36.6 C) (!) 97.2 F (36.2 C) (!) 97.4 F (36.3 C)   TempSrc: Oral Oral Oral   SpO2: 97% 92% 93% 91%  Weight:  105 kg (231 lb 8 oz)    Height:        Intake/Output Summary (Last 24 hours) at 12/19/2017 1800 Last data filed at 12/19/2017 1700 Gross per 24 hour  Intake 3330.75 ml  Output 1325 ml  Net 2005.75 ml   Filed Weights   12/17/17 1736 12/18/17 0527 12/19/17 0500  Weight: 100.9 kg (222 lb 7.1 oz) 100.7 kg (222 lb 0.1 oz) 105 kg (231 lb 8 oz)    Exam:   General:  AAO, NAD  Cardiovascular: S1,S2 present, no added hrt sound  Respiratory: Diminished BS B/l  Abdomen: Soft, non-tender,  non-distended, BS present  Musculoskeletal: No pedal edema bilaterally  Skin: Normal  Psychiatry: Normal affect    Data Reviewed: CBC: Recent Labs  Lab 12/16/17 0724 12/17/17 0353 12/19/17 0400  WBC 5.0 5.9 10.2  NEUTROABS 4.0  --  8.6*  HGB 8.4* 7.5* 7.1*  HCT 23.6* 21.8* 20.6*  MCV 97.9 100.5* 103.0*  PLT 157 172 141*   Basic Metabolic Panel: Recent Labs  Lab 12/16/17 0724   12/17/17 0353  12/17/17 1917 12/18/17 0228 12/18/17 0718 12/18/17 1335 12/19/17 0400  NA 119*   < > 127*   < > 129* 131* 130* 131* 131*  K 3.0*   < > 3.6   < > 3.7 4.0 3.3* 3.4* 3.5  CL 84*   < > 100*   < > 101 105 103 104 106  CO2 17*   < > 21*   < > 21* 22 20* 20* 19*  GLUCOSE 107*   < > 87   < > 115* 112* 99 85 116*  BUN 6   < > 5*   < > <5* 6 5* 6 9  CREATININE 0.71   < > 0.93   < > 1.10 1.07 1.06 1.27* 1.08  CALCIUM 8.5*   < > 7.7*   < > 8.3* 8.2* 8.3* 8.5* 8.6*  MG 1.8  --  1.6*  --   --  1.8  --   --   --   PHOS  --   --  3.5  --   --   --   --   --   --    < > = values in this interval not displayed.   GFR: Estimated Creatinine Clearance: 105.2 mL/min (by C-G formula based on SCr of 1.08 mg/dL). Liver Function Tests: Recent Labs  Lab 12/16/17 0724 12/17/17 0353  AST 246* 189*  ALT 52 44  ALKPHOS 78 62  BILITOT 0.7 0.8  PROT 5.7* 4.7*  ALBUMIN 3.1* 2.5*   Recent Labs  Lab 12/16/17 0724  LIPASE 82*   Recent Labs  Lab 12/16/17 0724  AMMONIA 17   Coagulation Profile: Recent Labs  Lab 12/16/17 0724  INR 0.91   Cardiac Enzymes: Recent Labs  Lab 12/16/17 1535 12/16/17 1912 12/17/17 0353  TROPONINI <0.03 <0.03 <0.03   BNP (last 3 results) No results for input(s): PROBNP in the last 8760 hours. HbA1C: No results for input(s): HGBA1C in the last 72 hours. CBG: Recent Labs  Lab 12/18/17 2358 12/19/17 0509 12/19/17 0737 12/19/17 1131 12/19/17 1654  GLUCAP 120* 119* 116* 108* 118*   Lipid Profile: No results for input(s): CHOL, HDL, LDLCALC, TRIG, CHOLHDL, LDLDIRECT in the last 72 hours. Thyroid Function Tests: No results for input(s): TSH, T4TOTAL, FREET4, T3FREE, THYROIDAB in the last 72 hours. Anemia Panel: Recent Labs    12/19/17 0400 12/19/17 0822  VITAMINB12 2,413*  --   FERRITIN  --  128  TIBC  --  280  IRON  --  119   Urine analysis:    Component Value Date/Time   COLORURINE AMBER (A) 12/16/2017 1026   APPEARANCEUR HAZY (A)  12/16/2017 1026   LABSPEC 1.024 12/16/2017 1026   PHURINE 5.0 12/16/2017 1026   GLUCOSEU NEGATIVE 12/16/2017 1026   HGBUR NEGATIVE 12/16/2017 1026   BILIRUBINUR SMALL (A) 12/16/2017 1026   KETONESUR 20 (A) 12/16/2017 1026   PROTEINUR NEGATIVE 12/16/2017 1026   NITRITE NEGATIVE 12/16/2017 1026   LEUKOCYTESUR TRACE (A) 12/16/2017 1026   Sepsis Labs: @LABRCNTIP (procalcitonin:4,lacticidven:4)  )  Recent Results (from the past 240 hour(s))  Blood Culture (routine x 2)     Status: None (Preliminary result)   Collection Time: 12/16/17  8:00 AM  Result Value Ref Range Status   Specimen Description BLOOD RIGHT ANTECUBITAL  Final   Special Requests   Final    BOTTLES DRAWN AEROBIC AND ANAEROBIC Blood Culture adequate volume   Culture NO GROWTH 3 DAYS  Final   Report Status PENDING  Incomplete  Blood Culture (routine x 2)     Status: None (Preliminary result)   Collection Time: 12/16/17  8:10 AM  Result Value Ref Range Status   Specimen Description BLOOD RIGHT FOREARM  Final   Special Requests   Final    BOTTLES DRAWN AEROBIC AND ANAEROBIC Blood Culture adequate volume   Culture NO GROWTH 3 DAYS  Final   Report Status PENDING  Incomplete  Urine culture     Status: None   Collection Time: 12/16/17 10:26 AM  Result Value Ref Range Status   Specimen Description URINE, CATHETERIZED  Final   Special Requests NONE  Final   Culture NO GROWTH  Final   Report Status 12/17/2017 FINAL  Final  MRSA PCR Screening     Status: None   Collection Time: 12/16/17  6:28 PM  Result Value Ref Range Status   MRSA by PCR NEGATIVE NEGATIVE Final    Comment:        The GeneXpert MRSA Assay (FDA approved for NASAL specimens only), is one component of a comprehensive MRSA colonization surveillance program. It is not intended to diagnose MRSA infection nor to guide or monitor treatment for MRSA infections.       Studies: Dg Chest Port 1 View  Result Date: 12/19/2017 CLINICAL DATA:  Shortness of  breath. EXAM: PORTABLE CHEST 1 VIEW COMPARISON:  12/17/2017. FINDINGS: Mediastinum and hilar structures normal. Cardiomegaly with normal pulmonary vascularity. Low lung volumes with mild basilar atelectasis and/or pleuroparenchymal scarring. Similar findings noted on prior exam. No pneumothorax. IMPRESSION: 1.  Cardiomegaly.  No pulmonary venous congestion. 2. COPD. Low lung volumes with bibasilar atelectasis and/or pleuroparenchymal scarring. Electronically Signed   By: Maisie Fus  Register   On: 12/19/2017 11:06    Scheduled Meds: . chlorhexidine  15 mL Mouth Rinse BID  . heparin  5,000 Units Subcutaneous Q8H  . hydrocortisone sod succinate (SOLU-CORTEF) inj  100 mg Intravenous Q8H  . Influenza vac split quadrivalent PF  0.5 mL Intramuscular Tomorrow-1000  . ipratropium-albuterol  3 mL Nebulization TID  . levothyroxine  100 mcg Oral QAC breakfast  . mouth rinse  15 mL Mouth Rinse q12n4p  . nicotine  14 mg Transdermal Daily  . pneumococcal 23 valent vaccine  0.5 mL Intramuscular Tomorrow-1000  . polyethylene glycol  17 g Oral Daily  . senna-docusate  1 tablet Oral BID    Continuous Infusions: . sodium chloride    . sodium chloride 75 mL/hr at 12/19/17 1010     LOS: 3 days     Briant Cedar, MD [facility]   If 7PM-7AM, please contact night-coverage [email or web address] Password [email or web address] 12/19/2017, 6:00 PM

## 2017-12-19 NOTE — Progress Notes (Signed)
Physical Therapy Treatment Patient Details Name: Bruce Simpson MRN: [id number] DOB: 1965/12/31 Today's Date: 12/19/2017    History of Present Illness Pt is a 52 y.o. male who presented to [facility] via EMS on 12/16/17 after being found unresponsive, hypotensive, and hypothermic outside; responded well to initial fluid resuscitation. PMH includes ETOH use, hypothyroidism (per pt), depression.     PT Comments    Pt performed gait training with single point cane and he required increased assistance during session this afternoon.  Plan next session for continued gait training with SPC to improve safety with mobility.  Pt does not have a safe environment to d/c to and will continue to require assistance from social work.     Follow Up Recommendations  No PT follow up     Equipment Recommendations  Cane    Recommendations for Other Services Other (comment)(SW)     Precautions / Restrictions Precautions Precautions: Fall Restrictions Weight Bearing Restrictions: No    Mobility  Bed Mobility Overal bed mobility: Independent             General bed mobility comments: Increased time and effort.    Transfers Overall transfer level: Needs assistance Equipment used: Straight cane Transfers: Sit to/from Stand Sit to Stand: Min assist;Min guard         General transfer comment: Pt performed with min assistance to boost into standing. Pt improved during 2nd two attempts to stand to min guard.  Pt is unsteady in standing and required min-min guard for assistance.    Ambulation/Gait Ambulation/Gait assistance: Min assist;Mod assist Ambulation Distance (Feet): 120 Feet(2nd trial of 20 ft.  ) Assistive device: Straight cane(Pt requires increased assistance without HHA on L, straight cane on R side.  ) Gait Pattern/deviations: Step-through pattern;Decreased stride length;Staggering right;Staggering left Gait velocity: Decreased Gait velocity interpretation: Below normal speed for  age/gender General Gait Details: Pt more unsteady with mobility and patient required increased assistance during [facility] training with gait.  Pt unable to sequencing L foot and cane safely in sequencing so required cueing for 3point gait pattern.  Pt required decreased assistance with 3 point gait pattern.     Stairs            Wheelchair Mobility    Modified Rankin (Stroke Patients Only)       Balance     Sitting balance-Leahy Scale: Good       Standing balance-Leahy Scale: Poor                              Cognition Arousal/Alertness: Awake/alert Behavior During Therapy: WFL for tasks assessed/performed Overall Cognitive Status: Within Functional Limits for tasks assessed                                        Exercises      General Comments        Pertinent Vitals/Pain Pain Assessment: 0-10 Pain Score: 5  Pain Location: Generalized stiffness Pain Descriptors / Indicators: Sore Pain Intervention(s): Monitored during session;Repositioned    Home Living                      Prior Function            PT Goals (current goals can now be found in the care plan section) Acute Rehab PT Goals Patient Stated  Goal: To be able to use his cane without assistance Potential to Achieve Goals: Good Progress towards PT goals: Progressing toward goals    Frequency    Min 3X/week      PT Plan Current plan remains appropriate    Co-evaluation              AM-PAC PT "6 Clicks" Daily Activity  Outcome Measure  Difficulty turning over in bed (including adjusting bedclothes, sheets and blankets)?: None Difficulty moving from lying on back to sitting on the side of the bed? : None Difficulty sitting down on and standing up from a chair with arms (e.g., wheelchair, bedside commode, etc,.)?: Unable Help needed moving to and from a bed to chair (including a wheelchair)?: A Little Help needed walking in hospital room?: A  Little Help needed climbing 3-5 steps with a railing? : A Lot 6 Click Score: 17    End of Session Equipment Utilized During Treatment: Gait belt Activity Tolerance: Patient tolerated treatment well Patient left: in bed;with call bell/phone within reach;with bed alarm set Nurse Communication: Mobility status PT Visit Diagnosis: Other abnormalities of gait and mobility (R26.89)     Time: [id number] PT Time Calculation (min) (ACUTE ONLY): 25 min  Charges:  $Gait Training: 8-22 mins $Therapeutic Activity: 8-22 mins                    G CodesJoycelyn Rua:       Deann Mclaine, PTA pager [phone]    Florestine Aversimee J Isabela Nardelli 12/19/2017, 4:31 PM

## 2017-12-19 NOTE — Plan of Care (Signed)
  Education: Knowledge of General Education information will improve 12/19/2017 0808 - Not Progressing by Loel LoftyLewis, Tonda Wiederhold P, RN

## 2017-12-19 NOTE — Progress Notes (Signed)
Pt's last BM 12/16/17.  Have requested orders from Dr. Sharolyn DouglasEzenduka for miralax and colace or senekot.  Waiting for orders.

## 2017-12-20 ENCOUNTER — Inpatient Hospital Stay (HOSPITAL_COMMUNITY): Payer: Self-pay

## 2017-12-20 DIAGNOSIS — I361 Nonrheumatic tricuspid (valve) insufficiency: Secondary | ICD-10-CM

## 2017-12-20 LAB — CBC WITH DIFFERENTIAL/PLATELET
BASOS ABS: 0 10*3/uL (ref 0.0–0.1)
Basophils Relative: 0 %
EOS PCT: 0 %
Eosinophils Absolute: 0 10*3/uL (ref 0.0–0.7)
HCT: 19.5 % — ABNORMAL LOW (ref 39.0–52.0)
Hemoglobin: 6.7 g/dL — CL (ref 13.0–17.0)
LYMPHS PCT: 12 %
Lymphs Abs: 1.4 10*3/uL (ref 0.7–4.0)
MCH: 35.1 pg — ABNORMAL HIGH (ref 26.0–34.0)
MCHC: 34.4 g/dL (ref 30.0–36.0)
MCV: 102.1 fL — ABNORMAL HIGH (ref 78.0–100.0)
Monocytes Absolute: 0.8 10*3/uL (ref 0.1–1.0)
Monocytes Relative: 7 %
NEUTROS ABS: 9 10*3/uL — AB (ref 1.7–7.7)
Neutrophils Relative %: 81 %
Platelets: 139 10*3/uL — ABNORMAL LOW (ref 150–400)
RBC: 1.91 MIL/uL — AB (ref 4.22–5.81)
RDW: 14.6 % (ref 11.5–15.5)
WBC: 11.1 10*3/uL — AB (ref 4.0–10.5)

## 2017-12-20 LAB — ECHOCARDIOGRAM COMPLETE
HEIGHTINCHES: 73 in
Weight: 3753.6 oz

## 2017-12-20 LAB — PREPARE RBC (CROSSMATCH)

## 2017-12-20 LAB — GLUCOSE, CAPILLARY
GLUCOSE-CAPILLARY: 111 mg/dL — AB (ref 65–99)
GLUCOSE-CAPILLARY: 112 mg/dL — AB (ref 65–99)
GLUCOSE-CAPILLARY: 108 mg/dL — AB (ref 65–99)
Glucose-Capillary: 123 mg/dL — ABNORMAL HIGH (ref 65–99)
Glucose-Capillary: 159 mg/dL — ABNORMAL HIGH (ref 65–99)

## 2017-12-20 LAB — CBC
HCT: 23.9 % — ABNORMAL LOW (ref 39.0–52.0)
Hemoglobin: 8.2 g/dL — ABNORMAL LOW (ref 13.0–17.0)
MCH: 34.3 pg — ABNORMAL HIGH (ref 26.0–34.0)
MCHC: 34.3 g/dL (ref 30.0–36.0)
MCV: 100 fL (ref 78.0–100.0)
Platelets: 148 10*3/uL — ABNORMAL LOW (ref 150–400)
RBC: 2.39 MIL/uL — AB (ref 4.22–5.81)
RDW: 16.1 % — ABNORMAL HIGH (ref 11.5–15.5)
WBC: 12.4 10*3/uL — AB (ref 4.0–10.5)

## 2017-12-20 LAB — BASIC METABOLIC PANEL
ANION GAP: 5 (ref 5–15)
BUN: 9 mg/dL (ref 6–20)
CO2: 18 mmol/L — ABNORMAL LOW (ref 22–32)
Calcium: 8.6 mg/dL — ABNORMAL LOW (ref 8.9–10.3)
Chloride: 108 mmol/L (ref 101–111)
Creatinine, Ser: 1.19 mg/dL (ref 0.61–1.24)
GFR calc Af Amer: >60 mL/min (ref >60)
GLUCOSE: 110 mg/dL — AB (ref 65–99)
POTASSIUM: 3.2 mmol/L — AB (ref 3.5–5.1)
Sodium: 131 mmol/L — ABNORMAL LOW (ref 135–145)

## 2017-12-20 LAB — BRAIN NATRIURETIC PEPTIDE: B NATRIURETIC PEPTIDE 5: 316 pg/mL — AB (ref 0.0–100.0)

## 2017-12-20 MED ORDER — SODIUM CHLORIDE 0.9 % IV SOLN: Freq: Once | INTRAVENOUS | Status: DC

## 2017-12-20 MED ORDER — SODIUM CHLORIDE 0.9% FLUSH: 10.0000 mL | INTRAVENOUS | Status: DC | PRN

## 2017-12-20 MED ORDER — POTASSIUM CHLORIDE CRYS ER 20 MEQ PO TBCR
40.0000 meq | EXTENDED_RELEASE_TABLET | Freq: Once | ORAL | Status: AC
  Administered 2017-12-20: 40 meq via ORAL
  Filled 2017-12-20: qty 2

## 2017-12-20 NOTE — Progress Notes (Signed)
PT Cancellation Note  Patient Details Name: Bruce Simpson MRN: [id number] DOB: [birth year]   Cancelled Treatment:    Reason Eval/Treat Not Completed: Patient at procedure or test/unavailable Patient off unit for test. PT will continue to follow acutely.    Derek MoundKellyn R Hanae Waiters Royale Lennartz, PTA Pager: [phone]   12/20/2017, 3:35 PM

## 2017-12-20 NOTE — Progress Notes (Signed)
PROGRESS NOTE  Bruce Simpson WJX:[id number] DOB: 06/23/1966 DOA: 12/16/2017 PCP: Patient, No Pcp Per  HPI/Recap of past 24 hours: 52 year old male who is homeless with history of alcohol/tobacco abuse, hypothyroidism, found unresponsive, hypotensive, hypothermic, bradycardic outside a liquor store PTA to our ER. In the ER, pt was altered, noted to have a sodium of 119, TSH of 17, LA 6.4, negative alcohol level. Pt was admitted to the ICU where he was managed for possible myxedema coma (pt reported not taking his synthroid for a while). Pt was resucitated with 6L of NS, started on IV synthroid and IV hydrocortisone. Pt was stabilized and ICU transferred services to [facility] starting 12/18/17.  Today, patient reported feeling okay.  Reports feeling leg wobbly on ambulation, denies any leg weakness.  Noted to have mild shortness of breath.  Denies any chest pain, cough, fever/chills.  Hemoglobin noted to be 6.7 this morning, denied any signs of bleeding  Assessment/Plan: Active Problems:   Hypotension   Hypothermia   Hyponatremia   Myxedema coma (HCC)  Acute metabolic encephalopathy Likely due to Myxedema coma Improving, now AAO Likely due to non-compliance to synthroid TSH on admission 17, T4 <0.25, cortisol level 15.1 CT head: Mild atrophy for age. Minimal periventricular small vessel disease UDS pos for marijuana Continue PO synthroid, D/C IV hydrocortisone  Hypotension Stable, SBP in 90-110 BP soft, not toxic looking, no signs of sepsis Likely due to above D/C IV hydrocortisone, IVF  Hypothermia/bradycardia Resolved Likely due to cold exposure, keep warm  Acute on chronic Hyponatremia Improving s/p IVF 119 on admission-->130s Hx of chronic alcohol abuse ??alcohol induced + intravascular vol depletion Daily BMP  Lactic acidosis Resolved s/p IVF No signs of sepsis CXR: Hyperinflation consistent with COPD, particularly bullous change at the RIGHT apex UA: neg,  UC no growth BC X 2, NGTD D/C IV Vanc + IV Zosyn, no signs of infection   Macrocytic anemia Hgb dropped to 6.7, s/p 1U of PRBC on 12/20/17 Hemodilution contributing as pt received massive amounts of IVF No signs of bleeding Vit B12 levels 2,413, Folate pending Iron panel WNL Monitor closely, daily cbc  Undiagnosed COPD Currently stable, smokes about 1.5 PPD, not on O2 CXR as above Duonebs  Tobacco abuse Counseled on cessation Nicotine patch  Alcohol abuse Drinks about 10 beers daily + Vodka AST 189, ALT 44, T.bili normal, no overt signs of liver disease Counseled on cessation If signs of withdrawal, start CIWA protocol. Pt reports no hx of withdrawal   Code Status: Full  Family Communication: Spoke to POA (friend) at bedside  Disposition Plan: Homeless, SW on board, likely shelter   Consultants:  PCCM  Procedures:  None  Antimicrobials:  S/P IV Vanc + Zosyn  DVT prophylaxis:  [place] Heparin   Objective: Vitals:   12/20/17 0931 12/20/17 0940 12/20/17 1126 12/20/17 1437  BP:  98/63 90/63   Pulse:  71 61   Resp:  17 18   Temp:  97.9 F (36.6 C) 98 F (36.7 C)   TempSrc:  Oral Oral   SpO2: 93% 93% 90% (!) 87%  Weight:      Height:        Intake/Output Summary (Last 24 hours) at 12/20/2017 1619 Last data filed at 12/20/2017 0940 Gross per 24 hour  Intake 3393.75 ml  Output 500 ml  Net 2893.75 ml   Filed Weights   12/18/17 0527 12/19/17 0500 12/20/17 0551  Weight: 100.7 kg (222 lb 0.1 oz) 105 kg (231  lb 8 oz) 106.4 kg (234 lb 9.6 oz)    Exam:   General:  AAO, NAD  Cardiovascular: S1,S2 present, no added hrt sound  Respiratory: Diminished BS B/l  Abdomen: Soft, non-tender, non-distended, BS present  Musculoskeletal: No pedal edema bilaterally  Skin: Normal  Psychiatry: Normal affect    Data Reviewed: CBC: Recent Labs  Lab 12/16/17 0724 12/17/17 0353 12/19/17 0400 12/20/17 0342 12/20/17 1037  WBC 5.0 5.9 10.2 11.1* 12.4*    NEUTROABS 4.0  --  8.6* 9.0*  --   HGB 8.4* 7.5* 7.1* 6.7* 8.2*  HCT 23.6* 21.8* 20.6* 19.5* 23.9*  MCV 97.9 100.5* 103.0* 102.1* 100.0  PLT 157 172 141* 139* 148*   Basic Metabolic Panel: Recent Labs  Lab 12/16/17 0724  12/17/17 0353  12/18/17 0228 12/18/17 0718 12/18/17 1335 12/19/17 0400 12/20/17 0342  NA 119*   < > 127*   < > 131* 130* 131* 131* 131*  K 3.0*   < > 3.6   < > 4.0 3.3* 3.4* 3.5 3.2*  CL 84*   < > 100*   < > 105 103 104 106 108  CO2 17*   < > 21*   < > 22 20* 20* 19* 18*  GLUCOSE 107*   < > 87   < > 112* 99 85 116* 110*  BUN 6   < > 5*   < > 6 5* 6 9 9   CREATININE 0.71   < > 0.93   < > 1.07 1.06 1.27* 1.08 1.19  CALCIUM 8.5*   < > 7.7*   < > 8.2* 8.3* 8.5* 8.6* 8.6*  MG 1.8  --  1.6*  --  1.8  --   --   --   --   PHOS  --   --  3.5  --   --   --   --   --   --    < > = values in this interval not displayed.   GFR: Estimated Creatinine Clearance: 96.1 mL/min (by C-G formula based on SCr of 1.19 mg/dL). Liver Function Tests: Recent Labs  Lab 12/16/17 0724 12/17/17 0353  AST 246* 189*  ALT 52 44  ALKPHOS 78 62  BILITOT 0.7 0.8  PROT 5.7* 4.7*  ALBUMIN 3.1* 2.5*   Recent Labs  Lab 12/16/17 0724  LIPASE 82*   Recent Labs  Lab 12/16/17 0724  AMMONIA 17   Coagulation Profile: Recent Labs  Lab 12/16/17 0724  INR 0.91   Cardiac Enzymes: Recent Labs  Lab 12/16/17 1535 12/16/17 1912 12/17/17 0353  TROPONINI <0.03 <0.03 <0.03   BNP (last 3 results) No results for input(s): PROBNP in the last 8760 hours. HbA1C: No results for input(s): HGBA1C in the last 72 hours. CBG: Recent Labs  Lab 12/19/17 2030 12/20/17 0112 12/20/17 0433 12/20/17 0746 12/20/17 1124  GLUCAP 100* 123* 111* 159* 112*   Lipid Profile: No results for input(s): CHOL, HDL, LDLCALC, TRIG, CHOLHDL, LDLDIRECT in the last 72 hours. Thyroid Function Tests: No results for input(s): TSH, T4TOTAL, FREET4, T3FREE, THYROIDAB in the last 72 hours. Anemia Panel: Recent  Labs    12/19/17 0400 12/19/17 0822  VITAMINB12 2,413*  --   FERRITIN  --  128  TIBC  --  280  IRON  --  119   Urine analysis:    Component Value Date/Time   COLORURINE AMBER (A) 12/16/2017 1026   APPEARANCEUR HAZY (A) 12/16/2017 1026   LABSPEC 1.024 12/16/2017 1026  PHURINE 5.0 12/16/2017 1026   GLUCOSEU NEGATIVE 12/16/2017 1026   HGBUR NEGATIVE 12/16/2017 1026   BILIRUBINUR SMALL (A) 12/16/2017 1026   KETONESUR 20 (A) 12/16/2017 1026   PROTEINUR NEGATIVE 12/16/2017 1026   NITRITE NEGATIVE 12/16/2017 1026   LEUKOCYTESUR TRACE (A) 12/16/2017 1026   Sepsis Labs: @LABRCNTIP (procalcitonin:4,lacticidven:4)  ) Recent Results (from the past 240 hour(s))  Blood Culture (routine x 2)     Status: None (Preliminary result)   Collection Time: 12/16/17  8:00 AM  Result Value Ref Range Status   Specimen Description BLOOD RIGHT ANTECUBITAL  Final   Special Requests   Final    BOTTLES DRAWN AEROBIC AND ANAEROBIC Blood Culture adequate volume   Culture NO GROWTH 4 DAYS  Final   Report Status PENDING  Incomplete  Blood Culture (routine x 2)     Status: None (Preliminary result)   Collection Time: 12/16/17  8:10 AM  Result Value Ref Range Status   Specimen Description BLOOD RIGHT FOREARM  Final   Special Requests   Final    BOTTLES DRAWN AEROBIC AND ANAEROBIC Blood Culture adequate volume   Culture NO GROWTH 4 DAYS  Final   Report Status PENDING  Incomplete  Urine culture     Status: None   Collection Time: 12/16/17 10:26 AM  Result Value Ref Range Status   Specimen Description URINE, CATHETERIZED  Final   Special Requests NONE  Final   Culture NO GROWTH  Final   Report Status 12/17/2017 FINAL  Final  MRSA PCR Screening     Status: None   Collection Time: 12/16/17  6:28 PM  Result Value Ref Range Status   MRSA by PCR NEGATIVE NEGATIVE Final    Comment:        The GeneXpert MRSA Assay (FDA approved for NASAL specimens only), is one component of a comprehensive MRSA  colonization surveillance program. It is not intended to diagnose MRSA infection nor to guide or monitor treatment for MRSA infections.       Studies: No results found.  Scheduled Meds: . chlorhexidine  15 mL Mouth Rinse BID  . heparin  5,000 Units Subcutaneous Q8H  . Influenza vac split quadrivalent PF  0.5 mL Intramuscular Tomorrow-1000  . ipratropium-albuterol  3 mL Nebulization TID  . levothyroxine  100 mcg Oral QAC breakfast  . mouth rinse  15 mL Mouth Rinse q12n4p  . nicotine  14 mg Transdermal Daily  . pneumococcal 23 valent vaccine  0.5 mL Intramuscular Tomorrow-1000  . polyethylene glycol  17 g Oral Daily  . senna-docusate  1 tablet Oral BID    Continuous Infusions: . sodium chloride    . sodium chloride       LOS: 4 days     Briant CedarNkeiruka J Ezenduka, MD [facility]   If 7PM-7AM, please contact night-coverage [email or web address] Password [email or web address] 12/20/2017, 4:19 PM

## 2017-12-20 NOTE — Progress Notes (Signed)
  Echocardiogram 2D Echocardiogram has been performed.  Bruce PartridgeBrooke S Alfonza Simpson 12/20/2017, 3:51 PM

## 2017-12-20 NOTE — Progress Notes (Signed)
Pharmacy called and stated okay to give subq. heparin if pt is not showing active signs of bleeding  Pt displays no signs of bleeding  Informed pt to call RN if he has a BM

## 2017-12-20 NOTE — Progress Notes (Signed)
CRITICAL VALUE ALERT  Critical Value:  Hgb 6.7  Date & Time Notied:  12/20/17 0441  Provider Notified: Rana SnareBodenheimer  Orders Received/Actions taken: awaiting

## 2017-12-21 LAB — BPAM RBC
Blood Product Expiration Date: 201901262359
ISSUE DATE / TIME: 201901120557
Unit Type and Rh: 9500

## 2017-12-21 LAB — BASIC METABOLIC PANEL
Anion gap: 9 (ref 5–15)
BUN: 11 mg/dL (ref 6–20)
CALCIUM: 9.5 mg/dL (ref 8.9–10.3)
CO2: 18 mmol/L — ABNORMAL LOW (ref 22–32)
CREATININE: 1.25 mg/dL — AB (ref 0.61–1.24)
Chloride: 109 mmol/L (ref 101–111)
Glucose, Bld: 73 mg/dL (ref 65–99)
Potassium: 3.3 mmol/L — ABNORMAL LOW (ref 3.5–5.1)
SODIUM: 136 mmol/L (ref 135–145)

## 2017-12-21 LAB — GLUCOSE, CAPILLARY
Glucose-Capillary: 80 mg/dL (ref 65–99)
Glucose-Capillary: 147 mg/dL — ABNORMAL HIGH (ref 65–99)

## 2017-12-21 LAB — CBC WITH DIFFERENTIAL/PLATELET
BASOS ABS: 0 10*3/uL (ref 0.0–0.1)
Basophils Relative: 0 %
EOS ABS: 0.1 10*3/uL (ref 0.0–0.7)
Eosinophils Relative: 1 %
HCT: 21.6 % — ABNORMAL LOW (ref 39.0–52.0)
HEMOGLOBIN: 7.5 g/dL — AB (ref 13.0–17.0)
LYMPHS ABS: 1.7 10*3/uL (ref 0.7–4.0)
Lymphocytes Relative: 20 %
MCH: 34.4 pg — AB (ref 26.0–34.0)
MCHC: 34.7 g/dL (ref 30.0–36.0)
MCV: 99.1 fL (ref 78.0–100.0)
MONO ABS: 0.6 10*3/uL (ref 0.1–1.0)
MONOS PCT: 7 %
NEUTROS ABS: 6.3 10*3/uL (ref 1.7–7.7)
Neutrophils Relative %: 72 %
PLATELETS: 133 10*3/uL — AB (ref 150–400)
RBC: 2.18 MIL/uL — AB (ref 4.22–5.81)
RDW: 17.6 % — AB (ref 11.5–15.5)
WBC: 8.7 10*3/uL (ref 4.0–10.5)

## 2017-12-21 LAB — CULTURE, BLOOD (ROUTINE X 2)
CULTURE: NO GROWTH
Culture: NO GROWTH
SPECIAL REQUESTS: ADEQUATE
SPECIAL REQUESTS: ADEQUATE

## 2017-12-21 LAB — TYPE AND SCREEN
ABO/RH(D): O NEG
ANTIBODY SCREEN: NEGATIVE
UNIT DIVISION: 0

## 2017-12-21 MED ORDER — POTASSIUM CHLORIDE CRYS ER 20 MEQ PO TBCR
40.0000 meq | EXTENDED_RELEASE_TABLET | Freq: Two times a day (BID) | ORAL | Status: AC
  Administered 2017-12-21: 40 meq via ORAL
  Filled 2017-12-21: qty 2

## 2017-12-21 MED ORDER — MINERAL OIL RE ENEM
1.0000 | ENEMA | Freq: Once | RECTAL | Status: AC
  Administered 2017-12-21: 1 via RECTAL
  Filled 2017-12-21: qty 1

## 2017-12-21 MED ORDER — FUROSEMIDE 10 MG/ML IJ SOLN
40.0000 mg | Freq: Two times a day (BID) | INTRAMUSCULAR | Status: AC
  Administered 2017-12-21 (×2): 40 mg via INTRAVENOUS
  Filled 2017-12-21 (×2): qty 4

## 2017-12-21 NOTE — Progress Notes (Signed)
PROGRESS NOTE  Bruce Simpson SMO:[id number] DOB: 23-Oct-1966 DOA: 12/16/2017 PCP: Patient, No Pcp Per  HPI/Recap of past 31 hours: 52 year old male who is homeless with history of alcohol/tobacco abuse, hypothyroidism, found unresponsive, hypotensive, hypothermic, bradycardic outside a liquor store PTA to our ER. In the ER, pt was altered, noted to have a sodium of 119, TSH of 17, LA 6.4, negative alcohol level. Pt was admitted to the ICU where he was managed for possible myxedema coma (pt reported not taking his synthroid for a while). Pt was resucitated with 6L of NS, started on IV synthroid and IV hydrocortisone. Pt was stabilized and ICU transferred services to [facility] starting 12/18/17.  Today, met patient sleeping, easily arousable.  Denies any new complaints, no signs of any bleeding.  Systolic blood pressure continues to be around the 90s. Patient is nontoxic-appearing.  Assessment/Plan: Active Problems:   Hypotension   Hypothermia   Hyponatremia   Myxedema coma (HCC)  Acute metabolic encephalopathy Likely due to Myxedema coma Improving, now AAO Likely due to non-compliance to synthroid TSH on admission 17, T4 <0.25, cortisol level 15.1 CT head: Mild atrophy for age. Minimal periventricular small vessel disease UDS pos for marijuana Continue PO synthroid, D/C IV hydrocortisone  Hypotension Stable, SBP in 90-110 BP soft, not toxic looking, no signs of sepsis Likely due to above D/C IV hydrocortisone, no IVF  Hypothermia/bradycardia Likely due to cold exposure, keep warm  Acute on chronic Hyponatremia Improving s/p IVF 119 on admission-->130s Hx of chronic alcohol abuse ??alcohol induced + intravascular vol depletion Daily BMP  Lactic acidosis Resolved s/p IVF No signs of sepsis CXR: Hyperinflation consistent with COPD, particularly bullous change at the RIGHT apex UA: neg, UC no growth BC X 2, NGTD D/C IV Vanc + IV Zosyn, no signs of infection    Macrocytic anemia Hgb dropped to 6.7, s/p 1U of PRBC on 12/20/17 Hemodilution contributing as pt received massive amounts of IVF No signs of bleeding Vit B12 levels 2,413, Folate pending Iron panel WNL Monitor closely, daily cbc  Undiagnosed COPD Currently stable, smokes about 1.5 PPD, not on O2, saturating well CXR as above Duonebs  Tobacco abuse Counseled on cessation Nicotine patch  Alcohol abuse Drinks about 10 beers daily + Vodka AST 189, ALT 44, T.bili normal, no overt signs of liver disease Counseled on cessation If signs of withdrawal, start CIWA protocol. Pt reports no hx of withdrawal   Code Status: Full  Family Communication: None at bedside  Disposition Plan: Homeless, SW on board, likely shelter   Consultants:  PCCM  Procedures:  None  Antimicrobials:  S/P IV Vanc + Zosyn  DVT prophylaxis:  [place] Heparin   Objective: Vitals:   12/21/17 0400 12/21/17 0430 12/21/17 1032 12/21/17 1156  BP: 99/71   94/70  Pulse: 65   64  Resp: 18   18  Temp: (!) 97.4 F (36.3 C)   (!) 97.5 F (36.4 C)  TempSrc: Oral   Oral  SpO2: 91% 94% 93% 90%  Weight: 104.2 kg (229 lb 11.2 oz)     Height:        Intake/Output Summary (Last 24 hours) at 12/21/2017 1622 Last data filed at 12/21/2017 1447 Gross per 24 hour  Intake 1080 ml  Output 4425 ml  Net -3345 ml   Filed Weights   12/19/17 0500 12/20/17 0551 12/21/17 0400  Weight: 105 kg (231 lb 8 oz) 106.4 kg (234 lb 9.6 oz) 104.2 kg (229 lb 11.2 oz)  Exam:   General:  AAO, NAD  Cardiovascular: S1,S2 present, no added hrt sound  Respiratory: Diminished BS B/l  Abdomen: Soft, non-tender, non-distended, BS present  Musculoskeletal: No pedal edema bilaterally  Skin: Normal  Psychiatry: Normal affect    Data Reviewed: CBC: Recent Labs  Lab 12/16/17 0724 12/17/17 0353 12/19/17 0400 12/20/17 0342 12/20/17 1037 12/21/17 0417  WBC 5.0 5.9 10.2 11.1* 12.4* 8.7  NEUTROABS 4.0  --  8.6* 9.0*   --  6.3  HGB 8.4* 7.5* 7.1* 6.7* 8.2* 7.5*  HCT 23.6* 21.8* 20.6* 19.5* 23.9* 21.6*  MCV 97.9 100.5* 103.0* 102.1* 100.0 99.1  PLT 157 172 141* 139* 148* 559*   Basic Metabolic Panel: Recent Labs  Lab 12/16/17 0724  12/17/17 0353  12/18/17 0228 12/18/17 0718 12/18/17 1335 12/19/17 0400 12/20/17 0342 12/21/17 0417  NA 119*   < > 127*   < > 131* 130* 131* 131* 131* 136  K 3.0*   < > 3.6   < > 4.0 3.3* 3.4* 3.5 3.2* 3.3*  CL 84*   < > 100*   < > 105 103 104 106 108 109  CO2 17*   < > 21*   < > 22 20* 20* 19* 18* 18*  GLUCOSE 107*   < > 87   < > 112* 99 85 116* 110* 73  BUN 6   < > 5*   < > 6 5* '6 9 9 11  '$ CREATININE 0.71   < > 0.93   < > 1.07 1.06 1.27* 1.08 1.19 1.25*  CALCIUM 8.5*   < > 7.7*   < > 8.2* 8.3* 8.5* 8.6* 8.6* 9.5  MG 1.8  --  1.6*  --  1.8  --   --   --   --   --   PHOS  --   --  3.5  --   --   --   --   --   --   --    < > = values in this interval not displayed.   GFR: Estimated Creatinine Clearance: 90.6 mL/min (A) (by C-G formula based on SCr of 1.25 mg/dL (H)). Liver Function Tests: Recent Labs  Lab 12/16/17 0724 12/17/17 0353  AST 246* 189*  ALT 52 44  ALKPHOS 78 62  BILITOT 0.7 0.8  PROT 5.7* 4.7*  ALBUMIN 3.1* 2.5*   Recent Labs  Lab 12/16/17 0724  LIPASE 82*   Recent Labs  Lab 12/16/17 0724  AMMONIA 17   Coagulation Profile: Recent Labs  Lab 12/16/17 0724  INR 0.91   Cardiac Enzymes: Recent Labs  Lab 12/16/17 1535 12/16/17 1912 12/17/17 0353  TROPONINI <0.03 <0.03 <0.03   BNP (last 3 results) No results for input(s): PROBNP in the last 8760 hours. HbA1C: No results for input(s): HGBA1C in the last 72 hours. CBG: Recent Labs  Lab 12/20/17 0433 12/20/17 0746 12/20/17 1124 12/20/17 2032 12/21/17 0358  GLUCAP 111* 159* 112* 108* 80   Lipid Profile: No results for input(s): CHOL, HDL, LDLCALC, TRIG, CHOLHDL, LDLDIRECT in the last 72 hours. Thyroid Function Tests: No results for input(s): TSH, T4TOTAL, FREET4, T3FREE,  THYROIDAB in the last 72 hours. Anemia Panel: Recent Labs    12/19/17 0400 12/19/17 0822  VITAMINB12 2,413*  --   FERRITIN  --  128  TIBC  --  280  IRON  --  119   Urine analysis:    Component Value Date/Time   COLORURINE AMBER (A) 12/16/2017 1026  APPEARANCEUR HAZY (A) 12/16/2017 1026   LABSPEC 1.024 12/16/2017 1026   PHURINE 5.0 12/16/2017 1026   GLUCOSEU NEGATIVE 12/16/2017 1026   HGBUR NEGATIVE 12/16/2017 1026   BILIRUBINUR SMALL (A) 12/16/2017 1026   KETONESUR 20 (A) 12/16/2017 1026   PROTEINUR NEGATIVE 12/16/2017 1026   NITRITE NEGATIVE 12/16/2017 1026   LEUKOCYTESUR TRACE (A) 12/16/2017 1026   Sepsis Labs: '@LABRCNTIP'$ (procalcitonin:4,lacticidven:4)  ) Recent Results (from the past 240 hour(s))  Blood Culture (routine x 2)     Status: None   Collection Time: 12/16/17  8:00 AM  Result Value Ref Range Status   Specimen Description BLOOD RIGHT ANTECUBITAL  Final   Special Requests   Final    BOTTLES DRAWN AEROBIC AND ANAEROBIC Blood Culture adequate volume   Culture NO GROWTH 5 DAYS  Final   Report Status 12/21/2017 FINAL  Final  Blood Culture (routine x 2)     Status: None   Collection Time: 12/16/17  8:10 AM  Result Value Ref Range Status   Specimen Description BLOOD RIGHT FOREARM  Final   Special Requests   Final    BOTTLES DRAWN AEROBIC AND ANAEROBIC Blood Culture adequate volume   Culture NO GROWTH 5 DAYS  Final   Report Status 12/21/2017 FINAL  Final  Urine culture     Status: None   Collection Time: 12/16/17 10:26 AM  Result Value Ref Range Status   Specimen Description URINE, CATHETERIZED  Final   Special Requests NONE  Final   Culture NO GROWTH  Final   Report Status 12/17/2017 FINAL  Final  MRSA PCR Screening     Status: None   Collection Time: 12/16/17  6:28 PM  Result Value Ref Range Status   MRSA by PCR NEGATIVE NEGATIVE Final    Comment:        The GeneXpert MRSA Assay (FDA approved for NASAL specimens only), is one component of  a comprehensive MRSA colonization surveillance program. It is not intended to diagnose MRSA infection nor to guide or monitor treatment for MRSA infections.       Studies: No results found.  Scheduled Meds: . chlorhexidine  15 mL Mouth Rinse BID  . furosemide  40 mg Intravenous BID  . heparin  5,000 Units Subcutaneous Q8H  . Influenza vac split quadrivalent PF  0.5 mL Intramuscular Tomorrow-1000  . ipratropium-albuterol  3 mL Nebulization TID  . levothyroxine  100 mcg Oral QAC breakfast  . mouth rinse  15 mL Mouth Rinse q12n4p  . mineral oil  1 enema Rectal Once  . nicotine  14 mg Transdermal Daily  . pneumococcal 23 valent vaccine  0.5 mL Intramuscular Tomorrow-1000  . polyethylene glycol  17 g Oral Daily  . senna-docusate  1 tablet Oral BID    Continuous Infusions: . sodium chloride    . sodium chloride       LOS: 5 days     Alma Friendly, MD [facility]   If 7PM-7AM, please contact night-coverage [email or web address] Password [facility] 12/21/2017, 4:22 PM

## 2017-12-21 NOTE — Evaluation (Signed)
Occupational Therapy Evaluation Patient Details Name: Bruce Simpson MRN: [id number] DOB: May 10, 1966 Today's Date: 12/21/2017    History of Present Illness Pt is a 52 y.o. male who presented to [facility] via EMS on 12/16/17 after being found unresponsive, hypotensive, and hypothermic outside; responded well to initial fluid resuscitation. PMH includes ETOH use, hypothyroidism (per pt), depression.    Clinical Impression   Pt admitted for above. Pt independent with ADLs, PTA. Feel pt will benefit from acute OT to increase independence prior to d/c. Recommending social worker talk to pt about d/c place.    Follow Up Recommendations  No OT follow up;Other (comment)(social work consult)    Equipment Recommendations  Other (comment)(TBD)    Recommendations for Other Services Other (comment)(Social Work consult)     Precautions / Restrictions Precautions Precautions: Fall Restrictions Weight Bearing Restrictions: No      Mobility Bed Mobility Overal bed mobility: Needs Assistance Bed Mobility: Supine to Sit     Supine to sit: Supervision     General bed mobility comments: Increased time and effort.    Transfers Overall transfer level: Needs assistance Equipment used: Straight cane Transfers: Sit to/from Stand Sit to Stand: Min guard         General transfer comment: Pt performed with min assistance to boost into standing. Pt improved during 2nd two attempts to stand to min guard.  Pt is unsteady in standing and required min-min guard for assistance.      Balance Slightly Unsteady during ambulation with cane                         ADL either performed or assessed with clinical judgement   ADL Overall ADL's : Needs assistance/impaired     Grooming: Wash/dry face;Standing;Supervision/safety;Set up   Upper Body Bathing: Set up;Supervision/ safety;Sitting;Standing           Lower Body Dressing: Sit to/from stand;Min guard   Toilet Transfer: Min  guard;Ambulation(cane; sit to stand from bed)           Functional mobility during ADLs: Min guard;Cane General ADL Comments: Educated on energy conservation. Pt able to don/doff socks but became short of breath.      Vision         Perception     Praxis      Pertinent Vitals/Pain Pain Assessment: No/denies pain     Hand Dominance     Extremity/Trunk Assessment Upper Extremity Assessment Upper Extremity Assessment: Overall WFL for tasks assessed   Lower Extremity Assessment Lower Extremity Assessment: Defer to PT evaluation       Communication Communication Communication: No difficulties   Cognition Arousal/Alertness: Awake/alert Behavior During Therapy: WFL for tasks assessed/performed Overall Cognitive Status: No family/caregiver present to determine baseline cognitive functioning(did not know date)                                     General Comments       Exercises     Shoulder Instructions      Home Living Family/patient expects to be discharged to:: Unsure                                 Additional Comments: trying to work out something with his friend for d/c      Prior Functioning/Environment Level of Independence:  Independent                 OT Problem List: Decreased strength;Decreased activity tolerance;Impaired balance (sitting and/or standing);Decreased cognition;Decreased knowledge of use of DME or AE;Decreased knowledge of precautions      OT Treatment/Interventions: Self-care/ADL training;DME and/or AE instruction;Energy conservation;Therapeutic activities;Cognitive remediation/compensation;Patient/family education;Balance training;Therapeutic exercise    OT Goals(Current goals can be found in the care plan section) Acute Rehab OT Goals Patient Stated Goal: not stated OT Goal Formulation: With patient Time For Goal Achievement: 12/28/17 Potential to Achieve Goals: Good ADL Goals Pt Will Perform  Lower Body Dressing: with modified independence;sit to/from stand Pt Will Transfer to Toilet: with modified independence;regular height toilet Pt Will Perform Toileting - Clothing Manipulation and hygiene: with modified independence;sit to/from stand Additional ADL Goal #1: Pt will independently verbalize 3 energy conservation techniques.  OT Frequency: Min 2X/week   Barriers to D/C:            Co-evaluation              AM-PAC PT "6 Clicks" Daily Activity     Outcome Measure Help from another person eating meals?: None Help from another person taking care of personal grooming?: A Little Help from another person toileting, which includes using toliet, bedpan, or urinal?: A Little Help from another person bathing (including washing, rinsing, drying)?: A Little Help from another person to put on and taking off regular upper body clothing?: A Little Help from another person to put on and taking off regular lower body clothing?: A Little 6 Click Score: 19   End of Session Equipment Utilized During Treatment: Gait belt;Other (comment)(cane)  Activity Tolerance: Patient tolerated treatment well Patient left: in bed;with bed alarm set  OT Visit Diagnosis: Unsteadiness on feet (R26.81)                Time: [id number] OT Time Calculation (min): 11 min Charges:  OT General Charges $OT Visit: 1 Visit OT Evaluation $OT Eval Moderate Complexity: 1 Mod G-Codes:      Trejan Buda L Rayni Nemitz OTR/L 12/21/2017, 10:37 AM

## 2017-12-21 NOTE — Progress Notes (Signed)
MD aware pt has not had BM in several days  Unable to collect occult stool  MD stated she will order enema

## 2017-12-21 NOTE — Progress Notes (Signed)
Physical Therapy Treatment Patient Details Name: Bruce Simpson MRN: [id number] DOB: 1966-11-20 Today's Date: 12/21/2017    History of Present Illness Pt is a 52 y.o. male who [facility]17 after being found unresponsive, hypotensive, and hypothermic outside; responded well to initial fluid resuscitation. PMH includes ETOH use, hypothyroidism (per pt), depression.     PT Comments    Patient seen for mobility progression. Ambulating with SPC. Improved stability but continues to require cues for cadence and use. Current POC remains appropriate.   Follow Up Recommendations  No PT follow up     Equipment Recommendations  Cane    Recommendations for Other Services Other (comment)(SW)     Precautions / Restrictions Precautions Precautions: Fall Restrictions Weight Bearing Restrictions: No    Mobility  Bed Mobility Overal bed mobility: Independent             General bed mobility comments: Increased time and effort.    Transfers Overall transfer level: Needs assistance Equipment used: Straight cane Transfers: Sit to/from Stand Sit to Stand: Min guard         General transfer comment: Pt performed with min assistance to boost into standing. Pt improved during 2nd two attempts to stand to min guard.  Pt is unsteady in standing and required min-min guard for assistance.    Ambulation/Gait Ambulation/Gait assistance: Min guard Ambulation Distance (Feet): 160 Feet Assistive device: Straight cane Gait Pattern/deviations: Step-through pattern;Decreased stride length;Staggering right;Staggering left Gait velocity: Decreased Gait velocity interpretation: Below normal speed for age/gender General Gait Details: improved stability with use of SPC. Educated patient on need for increased cadence   Stairs            Wheelchair Mobility    Modified Rankin (Stroke Patients Only)       Balance Overall balance assessment: Needs  assistance Sitting-balance support: Feet supported Sitting balance-Leahy Scale: Good     Standing balance support: During functional activity Standing balance-Leahy Scale: Fair                              Cognition Arousal/Alertness: Awake/alert Behavior During Therapy: WFL for tasks assessed/performed Overall Cognitive Status: Within Functional Limits for tasks assessed                                        Exercises      General Comments        Pertinent Vitals/Pain Pain Assessment: No/denies pain    Home Living                      Prior Function            PT Goals (current goals can now be found in the care plan section) Acute Rehab PT Goals Patient Stated Goal: To be able to use his cane without assistance PT Goal Formulation: With patient Time For Goal Achievement: 01/01/18 Potential to Achieve Goals: Good Progress towards PT goals: Progressing toward goals    Frequency    Min 3X/week      PT Plan Current plan remains appropriate    Co-evaluation              AM-PAC PT "6 Clicks" Daily Activity  Outcome Measure  Difficulty turning over in bed (including adjusting bedclothes, sheets and blankets)?: None Difficulty moving from  lying on back to sitting on the side of the bed? : None Difficulty sitting down on and standing up from a chair with arms (e.g., wheelchair, bedside commode, etc,.)?: Unable Help needed moving to and from a bed to chair (including a wheelchair)?: A Little Help needed walking in hospital room?: A Little Help needed climbing 3-5 steps with a railing? : A Lot 6 Click Score: 17    End of Session Equipment Utilized During Treatment: Gait belt Activity Tolerance: Patient tolerated treatment well Patient left: in bed;with call bell/phone within reach;with bed alarm set Nurse Communication: Mobility status PT Visit Diagnosis: Other abnormalities of gait and mobility (R26.89)      Time: 1610-9604 PT Time Calculation (min) (ACUTE ONLY): 18 min  Charges:  $Gait Training: 8-22 mins                    G Codes:       Charlotte Crumb, PT DPT  Board Certified Neurologic Specialist [phone]    Fabio Asa 12/21/2017, 9:58 AM

## 2017-12-21 NOTE — Progress Notes (Signed)
Informed pt of needing stool sample  Hat placed in toilet for collection   Informed pt to call staff for assistance, bed alarm set

## 2017-12-21 NOTE — Clinical Social Work Note (Signed)
CS[facility] yesterday and this morning. Left voicemail for case manager. Inquired about potential male bed availability for tomorrow and what the intake process will be, will he have to put his name down to keep a bed, etc.   Charlynn CourtSarah Stefen Juba, CSW [phone]

## 2017-12-22 ENCOUNTER — Inpatient Hospital Stay (HOSPITAL_COMMUNITY): Payer: Self-pay

## 2017-12-22 LAB — CBC WITH DIFFERENTIAL/PLATELET
BASOS PCT: 0 %
Basophils Absolute: 0 10*3/uL (ref 0.0–0.1)
EOS PCT: 1 %
Eosinophils Absolute: 0.1 10*3/uL (ref 0.0–0.7)
HEMATOCRIT: 22.6 % — AB (ref 39.0–52.0)
Hemoglobin: 7.7 g/dL — ABNORMAL LOW (ref 13.0–17.0)
LYMPHS ABS: 1.6 10*3/uL (ref 0.7–4.0)
Lymphocytes Relative: 22 %
MCH: 34.1 pg — AB (ref 26.0–34.0)
MCHC: 34.1 g/dL (ref 30.0–36.0)
MCV: 100 fL (ref 78.0–100.0)
MONO ABS: 0.8 10*3/uL (ref 0.1–1.0)
MONOS PCT: 11 %
NEUTROS ABS: 4.8 10*3/uL (ref 1.7–7.7)
Neutrophils Relative %: 66 %
PLATELETS: 137 10*3/uL — AB (ref 150–400)
RBC: 2.26 MIL/uL — AB (ref 4.22–5.81)
RDW: 17.8 % — AB (ref 11.5–15.5)
WBC: 7.3 10*3/uL (ref 4.0–10.5)

## 2017-12-22 LAB — FOLATE RBC
FOLATE, RBC: 1512 ng/mL (ref >498)
Folate, Hemolysate: 317.6 ng/mL
Hematocrit: 21 % — ABNORMAL LOW (ref 37.5–51.0)

## 2017-12-22 LAB — BASIC METABOLIC PANEL
Anion gap: 10 (ref 5–15)
BUN: 14 mg/dL (ref 6–20)
CALCIUM: 9 mg/dL (ref 8.9–10.3)
CO2: 23 mmol/L (ref 22–32)
CREATININE: 1.3 mg/dL — AB (ref 0.61–1.24)
Chloride: 104 mmol/L (ref 101–111)
GFR calc Af Amer: >60 mL/min (ref >60)
GLUCOSE: 88 mg/dL (ref 65–99)
Potassium: 3 mmol/L — ABNORMAL LOW (ref 3.5–5.1)
Sodium: 137 mmol/L (ref 135–145)

## 2017-12-22 LAB — GLUCOSE, CAPILLARY
GLUCOSE-CAPILLARY: 85 mg/dL (ref 65–99)
Glucose-Capillary: 83 mg/dL (ref 65–99)

## 2017-12-22 LAB — MAGNESIUM: Magnesium: 1.6 mg/dL — ABNORMAL LOW (ref 1.7–2.4)

## 2017-12-22 MED ORDER — POTASSIUM CHLORIDE CRYS ER 20 MEQ PO TBCR
40.0000 meq | EXTENDED_RELEASE_TABLET | Freq: Once | ORAL | Status: AC
  Administered 2017-12-22: 40 meq via ORAL
  Filled 2017-12-22: qty 2

## 2017-12-22 MED ORDER — LEVOTHYROXINE SODIUM 100 MCG PO TABS: 100.0000 ug | ORAL_TABLET | Freq: Every day | ORAL | 0 refills | Status: DC

## 2017-12-22 MED ORDER — NICOTINE 14 MG/24HR TD PT24: 14.0000 mg | MEDICATED_PATCH | Freq: Every day | TRANSDERMAL | 0 refills | Status: DC

## 2017-12-22 NOTE — Progress Notes (Signed)
Enema given yesterday with no success. Soap suds enema ordered for today. Suggested abdominal xray to MD before giving another enema to ensure no SBO. Order placed, awaiting radiology. Will continue to monitor.

## 2017-12-22 NOTE — Discharge Summary (Signed)
Discharge Summary  Bruce Simpson ZOX:[id number] DOB: 1966-08-25  PCP: Bruce Simpson, No Pcp Per  Admit date: 12/16/2017 Discharge date: 12/22/2017  Time spent: > 30 mins  Recommendations for Outpatient Follow-up:  1. PCP  Discharge Diagnoses:  Active Hospital Problems   Diagnosis Date Noted  . Hypothermia   . Hyponatremia   . Myxedema coma (HCC)   . Hypotension 12/16/2017    Resolved Hospital Problems  No resolved problems to display.    Discharge Condition: Stable  Diet recommendation: Heart healthy  Vitals:   12/22/17 0806 12/22/17 1347  BP:    Pulse: 74   Resp: 18   Temp:    SpO2: 92% 93%    History of present illness:  52 year old male who is homeless with history of alcohol/tobacco abuse, hypothyroidism, found unresponsive, hypotensive, hypothermic, bradycardic outside a liquor store PTA to our ER. In the ER, pt was altered, noted to have a sodium of 119, TSH of 17, LA 6.4, negative alcohol level. Pt was admitted to the ICU where he was managed for possible myxedema coma (pt reported not taking his synthroid for a while). Pt was resucitated with 6L of NS, started on IV synthroid and IV hydrocortisone. Pt was stabilized and ICU transferred services to [facility] starting 12/18/17.  Today, pt reported feeling much better overall.  Bruce Simpson denies any chest pain, shortness of breath, abdominal pain, fever/chills, diarrhea, nausea/vomiting.  Bruce Simpson is currently homeless and information about homeless shelters were provided for Bruce Simpson by SW.  Follow-up appointment with PCP [facility].  Paper scripts were given to Bruce Simpson to fill his medication.   Hospital Course:  Active Problems:   Hypotension   Hypothermia   Hyponatremia   Myxedema coma (HCC)  Acute metabolic encephalopathy Likely due to Myxedema coma Improving, now AAO Likely due to non-compliance to synthroid TSH on admission 17, T4 <0.25, cortisol level 15.1 CT head: Mild  atrophy for age. Minimal periventricular small vessel disease UDS pos for marijuana Continue PO synthroid, D/C IV hydrocortisone PCP to follow up  Hypotension Stable, SBP in 90-110 BP soft, not toxic looking, no signs of sepsis Likely due to above  Hypothermia/bradycardia Likely due to cold exposure  Acute on chronic Hyponatremia Improving s/p IVF 119 on admission-->130s Hx of chronic alcohol abuse ??alcohol induced + intravascular vol depletion PCP to follow up  Lactic acidosis Resolved s/p IVF No signs of sepsis CXR: Hyperinflation consistent with COPD, particularly bullous change at the RIGHT apex UA: neg, UC no growth BC X 2, NGTD D/C IV Vanc + IV Zosyn, no signs of infection   Macrocytic anemia Hgb dropped to 6.7, s/p 1U of PRBC on 12/20/17 Hemodilution contributing as pt received massive amounts of IVF No signs of bleeding Vit B12 levels 2,413, Folate pending Iron panel WNL PCP to follow up  Undiagnosed COPD Stable Currently stable, smokes about 1.5 PPD, not on O2, saturating well CXR as above  Tobacco abuse Counseled on cessation Nicotine patch given  Alcohol abuse Drinks about 10 beers daily + Vodka AST 189, ALT 44, T.bili normal, no overt signs of liver disease Counseled on cessation, outpt referrals given to Bruce Simpson   Procedures:  None  Consultations:  PCCM  Discharge Exam: BP 106/65 (BP Location: Right Arm)   Pulse 74   Temp 98.4 F (36.9 C) (Oral)   Resp 18   Ht 6\' 1"  (1.854 m)   Wt 97.3 kg (214 lb 6.4 oz) Comment: b scale  SpO2 93%  BMI 28.29 kg/m   General: Awake alert oriented, not in acute distress Cardiovascular: S1-S2 present, no added heart sounds Respiratory: Chest clear bilaterally, diminished breath sounds  Discharge Instructions You were cared for by a hospitalist during your hospital stay. If you have any questions about your discharge medications or the care you received while you were in the hospital after  you are discharged, you can call the unit and asked to speak with the hospitalist on call if the hospitalist that took care of you is not available. Once you are discharged, your primary care physician will handle any further medical issues. Please note that NO REFILLS for any discharge medications will be authorized once you are discharged, as it is imperative that you return to your primary care physician (or establish a relationship with a primary care physician if you do not have one) for your aftercare needs so that they can reassess your need for medications and monitor your lab values.  Discharge Instructions    Diet - low sodium heart healthy   Complete by:  As directed    Increase activity slowly   Complete by:  As directed      Allergies as of 12/22/2017   No Known Allergies     Medication List    TAKE these medications   levothyroxine 100 MCG tablet Commonly known as:  SYNTHROID, LEVOTHROID Take 1 tablet (100 mcg total) by mouth daily before breakfast. Start taking on:  12/23/2017   nicotine 14 mg/24hr patch Commonly known as:  NICODERM CQ - dosed in mg/24 hours Place 1 patch (14 mg total) onto the skin daily. Start taking on:  12/23/2017            Durable Medical Equipment  (From admission, onward)        Start     Ordered   12/18/17 1029  For home use only DME Cane  Once     12/18/17 1029     No Known Allergies Follow-up Information    [facility] - Dme.   Why:  arranged for cane to be delivered prior to discharge Contact information: [address] [place] [address] [phone]        [facility] On 01/02/2018.   Specialty:  Family Medicine Why:  at 10 am; please try to keep your apt or call to reschedule Contact information: [address] [place] [facility] [address] [phone]           The results of significant diagnostics from this hospitalization (including imaging,  microbiology, ancillary and laboratory) are listed below for reference.    Significant Diagnostic Studies: Ct Head Wo Contrast  Result Date: 12/16/2017 CLINICAL DATA:  Unwitnessed fall with altered level of consciousness EXAM: CT HEAD WITHOUT CONTRAST TECHNIQUE: Contiguous axial images were obtained from the base of the skull through the vertex without intravenous contrast. COMPARISON:  None. FINDINGS: Brain: There is mild atrophy for age. There is prominence of the cisterna magna, a presumed anatomic variant. There is no appreciable intracranial mass, hemorrhage, extra-axial fluid collection, or midline shift. There is minimal small vessel disease in the centra semiovale bilaterally. Elsewhere gray-white compartments appear normal. No evident acute infarct. Vascular: No hyperdense vessel. There is mild calcification in the right carotid siphon. Skull: Bony calvarium appears intact. Sinuses/Orbits: There is slight mucosal thickening in several ethmoid air cells. There is a rather minimal retention cyst in the medial left maxillary antrum. Orbits appear symmetric bilaterally. Other:  Mastoid air cells appear clear. IMPRESSION: Mild atrophy for age. Minimal periventricular small vessel disease. No mass, hemorrhage, or acute infarct. Mild arterial vascular calcification.  Mild paranasal sinus disease. Electronically Signed   By: Bretta BangWilliam  Woodruff III M.D.   On: 12/16/2017 09:20   Dg Chest Port 1 View  Result Date: 12/19/2017 CLINICAL DATA:  Shortness of breath. EXAM: PORTABLE CHEST 1 VIEW COMPARISON:  12/17/2017. FINDINGS: Mediastinum and hilar structures normal. Cardiomegaly with normal pulmonary vascularity. Low lung volumes with mild basilar atelectasis and/or pleuroparenchymal scarring. Similar findings noted on prior exam. No pneumothorax. IMPRESSION: 1.  Cardiomegaly.  No pulmonary venous congestion. 2. COPD. Low lung volumes with bibasilar atelectasis and/or pleuroparenchymal scarring. Electronically  Signed   By: Maisie Fushomas  Register   On: 12/19/2017 11:06   Dg Chest Port 1 View  Result Date: 12/17/2017 CLINICAL DATA:  Altered mental status. EXAM: PORTABLE CHEST 1 VIEW COMPARISON:  12/16/2017. FINDINGS: Stable cardiomediastinal silhouette. No consolidation or edema. Hyperinflation consistent with COPD, particularly bullous change at the RIGHT apex. No pneumothorax. Bones unremarkable. IMPRESSION: Stable chest. Electronically Signed   By: Elsie StainJohn T Curnes M.D.   On: 12/17/2017 07:20   Dg Chest Portable 1 View  Result Date: 12/16/2017 CLINICAL DATA:  Altered mental status and weakness EXAM: PORTABLE CHEST 1 VIEW COMPARISON:  None. FINDINGS: Cardiac shadow is within normal limits. Lungs are well aerated bilaterally. Emphysematous changes are noted concentrated in the right upper lobe. No focal infiltrate or sizable effusion is seen. No bony abnormality is noted. IMPRESSION: COPD without acute abnormality. Electronically Signed   By: Alcide CleverMark  Lukens M.D.   On: 12/16/2017 10:28   Dg Abd Portable 2v  Result Date: 12/22/2017 CLINICAL DATA:  The Bruce Simpson reports she has not had a bowel movement for 5 days. EXAM: PORTABLE ABDOMEN - 2 VIEW COMPARISON:  None. FINDINGS: No free intraperitoneal air is identified. The bowel gas pattern is nonobstructive. A large volume of stool is seen in the colon, most prominent in the ascending and transverse. No abnormal abdominal calcification or focal bony abnormality. IMPRESSION: No acute finding. Large colonic stool burden most notable in the ascending and transverse. Electronically Signed   By: Drusilla Kannerhomas  Dalessio M.D.   On: 12/22/2017 13:56    Microbiology: Recent Results (from the past 240 hour(s))  Blood Culture (routine x 2)     Status: None   Collection Time: 12/16/17  8:00 AM  Result Value Ref Range Status   Specimen Description BLOOD RIGHT ANTECUBITAL  Final   Special Requests   Final    BOTTLES DRAWN AEROBIC AND ANAEROBIC Blood Culture adequate volume   Culture NO  GROWTH 5 DAYS  Final   Report Status 12/21/2017 FINAL  Final  Blood Culture (routine x 2)     Status: None   Collection Time: 12/16/17  8:10 AM  Result Value Ref Range Status   Specimen Description BLOOD RIGHT FOREARM  Final   Special Requests   Final    BOTTLES DRAWN AEROBIC AND ANAEROBIC Blood Culture adequate volume   Culture NO GROWTH 5 DAYS  Final   Report Status 12/21/2017 FINAL  Final  Urine culture     Status: None   Collection Time: 12/16/17 10:26 AM  Result Value Ref Range Status   Specimen Description URINE, CATHETERIZED  Final   Special Requests NONE  Final   Culture NO GROWTH  Final   Report Status 12/17/2017 FINAL  Final  MRSA PCR Screening     Status: None   Collection  Time: 12/16/17  6:28 PM  Result Value Ref Range Status   MRSA by PCR NEGATIVE NEGATIVE Final    Comment:        The GeneXpert MRSA Assay (FDA approved for NASAL specimens only), is one component of a comprehensive MRSA colonization surveillance program. It is not intended to diagnose MRSA infection nor to guide or monitor treatment for MRSA infections.      Labs: Basic Metabolic Panel: Recent Labs  Lab 12/16/17 0724  12/17/17 0353  12/18/17 0228  12/18/17 1335 12/19/17 0400 12/20/17 0342 12/21/17 0417 12/22/17 0454  NA 119*   < > 127*   < > 131*   < > 131* 131* 131* 136 137  K 3.0*   < > 3.6   < > 4.0   < > 3.4* 3.5 3.2* 3.3* 3.0*  CL 84*   < > 100*   < > 105   < > 104 106 108 109 104  CO2 17*   < > 21*   < > 22   < > 20* 19* 18* 18* 23  GLUCOSE 107*   < > 87   < > 112*   < > 85 116* 110* 73 88  BUN 6   < > 5*   < > 6   < > 6 9 9 11 14   CREATININE 0.71   < > 0.93   < > 1.07   < > 1.27* 1.08 1.19 1.25* 1.30*  CALCIUM 8.5*   < > 7.7*   < > 8.2*   < > 8.5* 8.6* 8.6* 9.5 9.0  MG 1.8  --  1.6*  --  1.8  --   --   --   --   --  1.6*  PHOS  --   --  3.5  --   --   --   --   --   --   --   --    < > = values in this interval not displayed.   Liver Function Tests: Recent Labs  Lab  12/16/17 0724 12/17/17 0353  AST 246* 189*  ALT 52 44  ALKPHOS 78 62  BILITOT 0.7 0.8  PROT 5.7* 4.7*  ALBUMIN 3.1* 2.5*   Recent Labs  Lab 12/16/17 0724  LIPASE 82*   Recent Labs  Lab 12/16/17 0724  AMMONIA 17   CBC: Recent Labs  Lab 12/16/17 0724  12/19/17 0400 12/20/17 0342 12/20/17 1037 12/21/17 0417 12/22/17 0454  WBC 5.0   < > 10.2 11.1* 12.4* 8.7 7.3  NEUTROABS 4.0  --  8.6* 9.0*  --  6.3 4.8  HGB 8.4*   < > 7.1* 6.7* 8.2* 7.5* 7.7*  HCT 23.6*   < > 20.6*  21.0* 19.5* 23.9* 21.6* 22.6*  MCV 97.9   < > 103.0* 102.1* 100.0 99.1 100.0  PLT 157   < > 141* 139* 148* 133* 137*   < > = values in this interval not displayed.   Cardiac Enzymes: Recent Labs  Lab 12/16/17 1535 12/16/17 1912 12/17/17 0353  TROPONINI <0.03 <0.03 <0.03   BNP: BNP (last 3 results) Recent Labs    12/20/17 0342  BNP 316.0*    ProBNP (last 3 results) No results for input(s): PROBNP in the last 8760 hours.  CBG: Recent Labs  Lab 12/20/17 2032 12/21/17 0358 12/21/17 2114 12/22/17 0734 12/22/17 1131  GLUCAP 108* 80 147* 83 85       Signed:  Briant Cedar, MD  [facility] 12/22/2017, 6:40 PM

## 2017-12-22 NOTE — Progress Notes (Signed)
Physical Therapy Treatment Patient Details Name: Bruce Simpson MRN: [id number] DOB: 08-Jun-1966 Today's Date: 12/22/2017    History of Present Illness Pt is a 52 y.o. male who presented [facility] via EMS on 12/16/17 after being found unresponsive, hypotensive, and hypothermic outside; responded well to initial fluid resuscitation. PMH includes ETOH use, hypothyroidism (per pt), depression.     PT Comments    Today's skilled session focused on gait training with SPC. Pt increased ambulation distance with cane. Cues for sequencing and postural control throughout session. Pt overall steady with cane, moments of unsteadiness noted, but no overt LOB. Will continue to follow acutely to maximize safety with mobility and functional independence.    Follow Up Recommendations  No PT follow up     Equipment Recommendations  Cane    Recommendations for Other Services Other (comment)(SW)     Precautions / Restrictions Precautions Precautions: Fall Restrictions Weight Bearing Restrictions: No    Mobility  Bed Mobility Overal bed mobility: Needs Assistance Bed Mobility: Supine to Sit;Sit to Supine     Supine to sit: Supervision Sit to supine: Supervision   General bed mobility comments: In restroom on arrival attempting to have BM.  Transfers Overall transfer level: Needs assistance Equipment used: Straight cane Transfers: Sit to/from Stand Sit to Stand: Min guard Stand pivot transfers: Min guard       General transfer comment: Min guard for safety. Pt with no c/o dizziness or lightheadedness, though hgb is at 7.7  Ambulation/Gait Ambulation/Gait assistance: Min guard Ambulation Distance (Feet): 275 Feet Assistive device: Straight cane Gait Pattern/deviations: Step-through pattern;Decreased stride length;Staggering right;Staggering left Gait velocity: Decreased Gait velocity interpretation: Below normal speed for age/gender General Gait Details: Pt mostly stable with gait. 2  moments of instability noted, but no overt LOB.  VC for sequencing with SPC and for postural control.    Stairs            Wheelchair Mobility    Modified Rankin (Stroke Patients Only)       Balance Overall balance assessment: Needs assistance Sitting-balance support: Feet supported Sitting balance-Leahy Scale: Good     Standing balance support: During functional activity;Single extremity supported Standing balance-Leahy Scale: Fair Standing balance comment: UE support needed for balance                            Cognition Arousal/Alertness: Awake/alert Behavior During Therapy: WFL for tasks assessed/performed Overall Cognitive Status: No family/caregiver present to determine baseline cognitive functioning                                        Exercises General Exercises - Lower Extremity Hip Flexion/Marching: AROM;Both;10 reps;Seated    General Comments        Pertinent Vitals/Pain Pain Assessment: No/denies pain    Home Living                      Prior Function            PT Goals (current goals can now be found in the care plan section) Acute Rehab PT Goals Patient Stated Goal: None stated PT Goal Formulation: With patient Time For Goal Achievement: 01/01/18 Potential to Achieve Goals: Good Progress towards PT goals: Progressing toward goals    Frequency    Min 3X/week      PT Plan  Current plan remains appropriate    Co-evaluation              AM-PAC PT "6 Clicks" Daily Activity  Outcome Measure  Difficulty turning over in bed (including adjusting bedclothes, sheets and blankets)?: None Difficulty moving from lying on back to sitting on the side of the bed? : None Difficulty sitting down on and standing up from a chair with arms (e.g., wheelchair, bedside commode, etc,.)?: Unable Help needed moving to and from a bed to chair (including a wheelchair)?: A Little Help needed walking in hospital  room?: A Little Help needed climbing 3-5 steps with a railing? : A Little 6 Click Score: 18    End of Session Equipment Utilized During Treatment: Gait belt Activity Tolerance: Patient tolerated treatment well Patient left: in bed;with call bell/phone within reach;with bed alarm set Nurse Communication: Mobility status PT Visit Diagnosis: Other abnormalities of gait and mobility (R26.89)     Time: [id number] PT Time Calculation (min) (ACUTE ONLY): 16 min  Charges:  $Gait Training: 8-22 mins                    G Codes:       Kallie LocksHannah Kaidance Pantoja, [place] Pager [phone] Acute Rehab   Sheral ApleyHannah E Jedrick Hutcherson 12/22/2017, 11:44 AM

## 2017-12-22 NOTE — Progress Notes (Signed)
CSW met with patient at bedside to offer additional resources; previous CSW already provided some resources and assisted patient in calling shelter. Patient alert and oriented and sitting up on edge of bed. Patient with pleasant affect.   CSW and patient discussed patient's living situation and history of substance use. Patient indicated he has been homeless on and off for several years and has recently been living in a tent. Patient is familiar with the [facility] but was not aware of their emergency winter shelter. CSW called [facility] while with patient, but staff indicated their emergency shelter is not open tonight; shelter is open when temps are below freezing. CSW provided list of other area shelters.  Patient requested substance use treatment resources. Patient reported alcohol use most everyday up until approximately a week before hospital admission. CSW provided psycho-education on alcohol withdrawal and recovery. CSW and patient discussed patient's motivation to remain in recovery and patient's coping skills and protective factors. CSW provided empathetic and reflective listening. CSW also provided list of outpatient and inpatient treatment resources.   Patient indicated his friend can pick him up and provide transportation when he is discharged, but he is unable to stay with this friend. Patient to follow up on resources provided by CSW.  CSW signing off.

## 2017-12-22 NOTE — Progress Notes (Signed)
Occupational Therapy Treatment Patient Details Name: Bruce Simpson MRN: [id number] DOB: 1966/06/05 Today's Date: 12/22/2017    History of present illness Pt is a 52 y.o. male who presented to [facility] via EMS on 12/16/17 after being found unresponsive, hypotensive, and hypothermic outside; responded well to initial fluid resuscitation. PMH includes ETOH use, hypothyroidism (per pt), depression.    OT comments  Pt seen for acute OT ADL retraining session with focus on functional mobility/transfers, grooming and education on energy conservation techniques (handout issued and reviewed with pt today). Noted low hgb prior to session therefore close supervision-Min guard assist throughout, pt w/o c/o lightheadedness, feeling dizzy etc. Pt stated "I feel better than I have been".    Follow Up Recommendations  No OT follow up;Other (comment)(Social work consult)    Equipment Recommendations  Other (comment)(TBD)    Recommendations for Other Services      Precautions / Restrictions Precautions Precautions: Fall Restrictions Weight Bearing Restrictions: No       Mobility Bed Mobility Overal bed mobility: Needs Assistance Bed Mobility: Supine to Sit;Sit to Supine     Supine to sit: Supervision Sit to supine: Supervision   General bed mobility comments: Increased time and effort.    Transfers Overall transfer level: Needs assistance Equipment used: Straight cane Transfers: Sit to/from [employer] Sit to Stand: Min guard Stand pivot transfers: Min guard       General transfer comment: Min guard Assist throughout session secondary to noted low hgb today.  Pt is overall unsteady in standing and required min-min guard for assistance.      Balance Overall balance assessment: Needs assistance Sitting-balance support: Feet supported Sitting balance-Leahy Scale: Good     Standing balance support: During functional activity;Single extremity supported;No upper extremity  supported Standing balance-Leahy Scale: Fair                             ADL either performed or assessed with clinical judgement   ADL Overall ADL's : Needs assistance/impaired     Grooming: Wash/dry face;Standing;Supervision/safety;Set up;Wash/dry hands Grooming Details (indicate cue type and reason): Standing at sink this date - close supervision secondary to low hgb                 Toilet Transfer: Min guard;Ambulation;Cueing for safety(Cane w/ cues for sequencing, Sit to stand from EOB) Toilet Transfer Details (indicate cue type and reason): Pt with low hgb noted today, no orders in chart for transfusion, Min guard A throughout. Orders noted for ambulation in hall. Pt agreeable to amb to bathroom for transfer training and states "I feel better than I have been" Toileting- Clothing Manipulation and Hygiene: Min guard;Sit to/from stand         General ADL Comments: ADL retraining session with focus on: Handout issued and reviewed for energy conservation during ADL's; bed mobility and functional transfers, standing at sink for grooming and toilet transfers. Close S-Min guard assist throughout session secondary to low hgb noted.      Vision       Perception     Praxis      Cognition Arousal/Alertness: Awake/alert Behavior During Therapy: WFL for tasks assessed/performed Overall Cognitive Status: No family/caregiver present to determine baseline cognitive functioning  Exercises     Shoulder Instructions       General Comments      Pertinent Vitals/ Pain       Pain Assessment: No/denies pain  Home Living                                          Prior Functioning/Environment              Frequency  Min 2X/week        Progress Toward Goals  OT Goals(current goals can now be found in the care plan section)  Progress towards OT goals: Progressing toward  goals  Acute Rehab OT Goals Patient Stated Goal: None stated  Plan Discharge plan remains appropriate    Co-evaluation                 AM-PAC PT "6 Clicks" Daily Activity     Outcome Measure   Help from another person eating meals?: None Help from another person taking care of personal grooming?: A Little Help from another person toileting, which includes using toliet, bedpan, or urinal?: A Little Help from another person bathing (including washing, rinsing, drying)?: A Little Help from another person to put on and taking off regular upper body clothing?: A Little Help from another person to put on and taking off regular lower body clothing?: A Little 6 Click Score: 19    End of Session Equipment Utilized During Treatment: Gait belt;Other (comment)(SPC)  OT Visit Diagnosis: Unsteadiness on feet (R26.81)   Activity Tolerance Patient tolerated treatment well   Patient Left in bed;with bed alarm set   Nurse Communication          Time: [phone] OT Time Calculation (min): 16 min  Charges: OT General Charges $OT Visit: 1 Visit OT Treatments $Self Care/Home Management : 8-22 mins    Breanna Mcdaniel Beth Dixon, OTR/L 12/22/2017, 10:31 AM

## 2017-12-23 LAB — PATHOLOGIST SMEAR REVIEW

## 2017-12-28 ENCOUNTER — Inpatient Hospital Stay (HOSPITAL_COMMUNITY): Payer: Self-pay

## 2017-12-28 ENCOUNTER — Inpatient Hospital Stay (HOSPITAL_COMMUNITY)
Admission: EM | Admit: 2017-12-28 | Discharge: 2018-01-05 | DRG: 177 | Disposition: A | Discharge status: Home / Self Care | Payer: Self-pay | Attending: Internal Medicine | Admitting: Internal Medicine

## 2017-12-28 DIAGNOSIS — I959 Hypotension, unspecified: Secondary | ICD-10-CM | POA: Diagnosis present

## 2017-12-28 DIAGNOSIS — G9341 Metabolic encephalopathy: Secondary | ICD-10-CM | POA: Diagnosis present

## 2017-12-28 DIAGNOSIS — E035 Myxedema coma: Secondary | ICD-10-CM

## 2017-12-28 DIAGNOSIS — F329 Major depressive disorder, single episode, unspecified: Secondary | ICD-10-CM | POA: Diagnosis present

## 2017-12-28 DIAGNOSIS — D696 Thrombocytopenia, unspecified: Secondary | ICD-10-CM | POA: Diagnosis present

## 2017-12-28 DIAGNOSIS — G9340 Encephalopathy, unspecified: Secondary | ICD-10-CM

## 2017-12-28 DIAGNOSIS — J9611 Chronic respiratory failure with hypoxia: Secondary | ICD-10-CM | POA: Diagnosis present

## 2017-12-28 DIAGNOSIS — R0602 Shortness of breath: Secondary | ICD-10-CM

## 2017-12-28 DIAGNOSIS — F1210 Cannabis abuse, uncomplicated: Secondary | ICD-10-CM | POA: Diagnosis present

## 2017-12-28 DIAGNOSIS — X31XXXA Exposure to excessive natural cold, initial encounter: Secondary | ICD-10-CM

## 2017-12-28 DIAGNOSIS — D509 Iron deficiency anemia, unspecified: Secondary | ICD-10-CM | POA: Diagnosis present

## 2017-12-28 DIAGNOSIS — Z79899 Other long term (current) drug therapy: Secondary | ICD-10-CM

## 2017-12-28 DIAGNOSIS — J9601 Acute respiratory failure with hypoxia: Secondary | ICD-10-CM | POA: Diagnosis present

## 2017-12-28 DIAGNOSIS — Z6829 Body mass index (BMI) 29.0-29.9, adult: Secondary | ICD-10-CM

## 2017-12-28 DIAGNOSIS — F1010 Alcohol abuse, uncomplicated: Secondary | ICD-10-CM | POA: Diagnosis present

## 2017-12-28 DIAGNOSIS — E876 Hypokalemia: Secondary | ICD-10-CM | POA: Diagnosis present

## 2017-12-28 DIAGNOSIS — E669 Obesity, unspecified: Secondary | ICD-10-CM | POA: Diagnosis present

## 2017-12-28 DIAGNOSIS — J690 Pneumonitis due to inhalation of food and vomit: Principal | ICD-10-CM | POA: Diagnosis present

## 2017-12-28 DIAGNOSIS — T68XXXA Hypothermia, initial encounter: Secondary | ICD-10-CM | POA: Diagnosis present

## 2017-12-28 DIAGNOSIS — E039 Hypothyroidism, unspecified: Secondary | ICD-10-CM | POA: Diagnosis present

## 2017-12-28 DIAGNOSIS — F17210 Nicotine dependence, cigarettes, uncomplicated: Secondary | ICD-10-CM | POA: Diagnosis present

## 2017-12-28 DIAGNOSIS — T699XXA Effect of reduced temperature, unspecified, initial encounter: Secondary | ICD-10-CM | POA: Diagnosis present

## 2017-12-28 DIAGNOSIS — J441 Chronic obstructive pulmonary disease with (acute) exacerbation: Secondary | ICD-10-CM | POA: Diagnosis present

## 2017-12-28 DIAGNOSIS — Z590 Homelessness: Secondary | ICD-10-CM

## 2017-12-28 DIAGNOSIS — D539 Nutritional anemia, unspecified: Secondary | ICD-10-CM | POA: Diagnosis present

## 2017-12-28 LAB — CBG MONITORING, ED: Glucose-Capillary: 106 mg/dL — ABNORMAL HIGH (ref 65–99)

## 2017-12-28 LAB — CBC WITH DIFFERENTIAL/PLATELET
BASOS ABS: 0 10*3/uL (ref 0.0–0.1)
Basophils Relative: 0 %
Eosinophils Absolute: 0 10*3/uL (ref 0.0–0.7)
Eosinophils Relative: 0 %
HEMATOCRIT: 26.7 % — AB (ref 39.0–52.0)
HEMOGLOBIN: 9.1 g/dL — AB (ref 13.0–17.0)
LYMPHS PCT: 11 %
Lymphs Abs: 0.3 10*3/uL — ABNORMAL LOW (ref 0.7–4.0)
MCH: 34.9 pg — ABNORMAL HIGH (ref 26.0–34.0)
MCHC: 34.1 g/dL (ref 30.0–36.0)
MCV: 102.3 fL — ABNORMAL HIGH (ref 78.0–100.0)
MONOS PCT: 6 %
Monocytes Absolute: 0.2 10*3/uL (ref 0.1–1.0)
NEUTROS PCT: 83 %
Neutro Abs: 2.5 10*3/uL (ref 1.7–7.7)
Platelets: 100 10*3/uL — ABNORMAL LOW (ref 150–400)
RBC: 2.61 MIL/uL — ABNORMAL LOW (ref 4.22–5.81)
RDW: 17.4 % — ABNORMAL HIGH (ref 11.5–15.5)
WBC MORPHOLOGY: INCREASED
WBC: 3 10*3/uL — ABNORMAL LOW (ref 4.0–10.5)

## 2017-12-28 LAB — BLOOD GAS, ARTERIAL
ACID-BASE DEFICIT: 2.4 mmol/L — AB (ref 0.0–2.0)
Acid-base deficit: 4 mmol/L — ABNORMAL HIGH (ref 0.0–2.0)
Bicarbonate: 22.7 mmol/L (ref 20.0–28.0)
Bicarbonate: 23.7 mmol/L (ref 20.0–28.0)
DRAWN BY: 257881
Delivery systems: POSITIVE
Drawn by: 257881
FIO2: 100
FIO2: 100
LHR: 20 {breaths}/min
MODE: POSITIVE
O2 Saturation: 77.4 %
O2 Saturation: 89.2 %
PCO2 ART: 36.9 mmHg (ref 32.0–48.0)
PH ART: 7.353 (ref 7.350–7.450)
PH ART: 7.362 (ref 7.350–7.450)
PRESSURE CONTROL: 23 cmH2O
Patient temperature: 84.2
Patient temperature: 88.5
pCO2 arterial: 39.4 mmHg (ref 32.0–48.0)
pO2, Arterial: 32.5 mmHg — CL (ref 83.0–108.0)
pO2, Arterial: 47.5 mmHg — ABNORMAL LOW (ref 83.0–108.0)

## 2017-12-28 LAB — URINALYSIS, ROUTINE W REFLEX MICROSCOPIC
Bacteria, UA: NONE SEEN
Bilirubin Urine: NEGATIVE
Glucose, UA: NEGATIVE mg/dL
Hgb urine dipstick: NEGATIVE
KETONES UR: 5 mg/dL — AB
LEUKOCYTES UA: NEGATIVE
Nitrite: NEGATIVE
PH: 5 (ref 5.0–8.0)
Protein, ur: 30 mg/dL — AB
Specific Gravity, Urine: 1.019 (ref 1.005–1.030)

## 2017-12-28 LAB — RAPID URINE DRUG SCREEN, HOSP PERFORMED
Amphetamines: NOT DETECTED
BARBITURATES: NOT DETECTED
Benzodiazepines: NOT DETECTED
COCAINE: NOT DETECTED
Opiates: NOT DETECTED
TETRAHYDROCANNABINOL: POSITIVE — AB

## 2017-12-28 LAB — COMPREHENSIVE METABOLIC PANEL
ALBUMIN: 3.4 g/dL — AB (ref 3.5–5.0)
ALT: 65 U/L — ABNORMAL HIGH (ref 17–63)
ANION GAP: 10 (ref 5–15)
AST: 100 U/L — ABNORMAL HIGH (ref 15–41)
Alkaline Phosphatase: 116 U/L (ref 38–126)
BUN: 21 mg/dL — ABNORMAL HIGH (ref 6–20)
CHLORIDE: 103 mmol/L (ref 101–111)
CO2: 23 mmol/L (ref 22–32)
Calcium: 8.6 mg/dL — ABNORMAL LOW (ref 8.9–10.3)
Creatinine, Ser: 0.99 mg/dL (ref 0.61–1.24)
GFR calc Af Amer: >60 mL/min (ref >60)
GFR calc non Af Amer: >60 mL/min (ref >60)
GLUCOSE: 113 mg/dL — AB (ref 65–99)
POTASSIUM: 3.1 mmol/L — AB (ref 3.5–5.1)
SODIUM: 136 mmol/L (ref 135–145)
TOTAL PROTEIN: 6.2 g/dL — AB (ref 6.5–8.1)
Total Bilirubin: 0.6 mg/dL (ref 0.3–1.2)

## 2017-12-28 LAB — T4, FREE: FREE T4: 0.48 ng/dL — AB (ref 0.61–1.12)

## 2017-12-28 LAB — PROTIME-INR
INR: 0.94
Prothrombin Time: 12.5 seconds (ref 11.4–15.2)

## 2017-12-28 LAB — VITAMIN B12: VITAMIN B 12: 2220 pg/mL — AB (ref 180–914)

## 2017-12-28 LAB — CK: CK TOTAL: 985 U/L — AB (ref 49–397)

## 2017-12-28 LAB — SALICYLATE LEVEL: Salicylate Lvl: <7 mg/dL (ref 2.8–30.0)

## 2017-12-28 LAB — CORTISOL: CORTISOL PLASMA: 25.2 ug/dL

## 2017-12-28 LAB — MAGNESIUM: Magnesium: 2.1 mg/dL (ref 1.7–2.4)

## 2017-12-28 LAB — ACETAMINOPHEN LEVEL: Acetaminophen (Tylenol), Serum: <10 ug/mL — ABNORMAL LOW (ref 10–30)

## 2017-12-28 LAB — TROPONIN I: Troponin I: <0.03 ng/mL (ref <0.03)

## 2017-12-28 LAB — ETHANOL

## 2017-12-28 LAB — TSH: TSH: 15.442 u[IU]/mL — AB (ref 0.350–4.500)

## 2017-12-28 LAB — APTT: APTT: 38 s — AB (ref 24–36)

## 2017-12-28 LAB — MRSA PCR SCREENING: MRSA by PCR: NEGATIVE

## 2017-12-28 MED ORDER — IPRATROPIUM-ALBUTEROL 0.5-2.5 (3) MG/3ML IN SOLN
3.0000 mL | Freq: Three times a day (TID) | RESPIRATORY_TRACT | Status: DC
  Administered 2017-12-28 – 2018-01-02 (×14): 3 mL via RESPIRATORY_TRACT
  Filled 2017-12-28 (×14): qty 3

## 2017-12-28 MED ORDER — FAMOTIDINE IN NACL 20-0.9 MG/50ML-% IV SOLN
20.0000 mg | Freq: Two times a day (BID) | INTRAVENOUS | Status: DC
  Administered 2017-12-28 – 2017-12-29 (×4): 20 mg via INTRAVENOUS
  Filled 2017-12-28 (×5): qty 50

## 2017-12-28 MED ORDER — KCL IN DEXTROSE-NACL 40-5-0.45 MEQ/L-%-% IV SOLN
INTRAVENOUS | Status: DC
  Administered 2017-12-28 – 2017-12-29 (×3): via INTRAVENOUS
  Filled 2017-12-28 (×5): qty 1000

## 2017-12-28 MED ORDER — HYDROCORTISONE NA SUCCINATE PF 100 MG IJ SOLR
100.0000 mg | Freq: Three times a day (TID) | INTRAMUSCULAR | Status: DC
  Administered 2017-12-28 – 2017-12-29 (×4): 100 mg via INTRAVENOUS
  Filled 2017-12-28 (×4): qty 2

## 2017-12-28 MED ORDER — LEVOTHYROXINE SODIUM 100 MCG IV SOLR
50.0000 ug | Freq: Every day | INTRAVENOUS | Status: DC
  Administered 2017-12-28 – 2017-12-29 (×2): 50 ug via INTRAVENOUS
  Filled 2017-12-28 (×3): qty 5

## 2017-12-28 MED ORDER — LACTATED RINGERS IV BOLUS (SEPSIS)
2000.0000 mL | Freq: Once | INTRAVENOUS | Status: AC
  Administered 2017-12-28: 2000 mL via INTRAVENOUS

## 2017-12-28 MED ORDER — LACTATED RINGERS IV BOLUS (SEPSIS)
1000.0000 mL | Freq: Once | INTRAVENOUS | Status: AC
  Administered 2017-12-28: 1000 mL via INTRAVENOUS

## 2017-12-28 MED ORDER — IPRATROPIUM-ALBUTEROL 0.5-2.5 (3) MG/3ML IN SOLN
3.0000 mL | Freq: Four times a day (QID) | RESPIRATORY_TRACT | Status: DC
  Administered 2017-12-28: 3 mL via RESPIRATORY_TRACT
  Filled 2017-12-28: qty 3

## 2017-12-28 MED ORDER — SODIUM CHLORIDE 0.9 % IV SOLN: 250.0000 mL | INTRAVENOUS | Status: DC | PRN

## 2017-12-28 MED ORDER — POTASSIUM CHLORIDE 10 MEQ/100ML IV SOLN
10.0000 meq | INTRAVENOUS | Status: AC
  Administered 2017-12-28 (×4): 10 meq via INTRAVENOUS
  Filled 2017-12-28 (×4): qty 100

## 2017-12-28 MED ORDER — HEPARIN SODIUM (PORCINE) 5000 UNIT/ML IJ SOLN
5000.0000 [IU] | Freq: Three times a day (TID) | INTRAMUSCULAR | Status: DC
  Administered 2017-12-28 – 2018-01-05 (×21): 5000 [IU] via SUBCUTANEOUS
  Filled 2017-12-28 (×23): qty 1

## 2017-12-28 NOTE — H&P (Addendum)
[facility] Pulmonary Diseases & Critical Care Medicine HISTORY & PHYSICAL  Patient Name: Bruce Simpson MRN: [id number] DOB: 09-23-1966    ADMISSION DATE:  12/28/2017 DATE OF SERVICE:  12/28/2017  CHIEF COMPLAINT:  Altered mental status, hypothermia, hypotension   HISTORY OF PRESENT ILLNESS  This [age] y.o. obese Caucasian male [facility] with hypothermia and altered mental status (combative). At the time of clinical interview, the patient is somnolent but arousable. He is on a BiPAP, laying flat and appears to be able to protect his airway. However, he does not participate in clinical interview.   He was reportedly found masturbating outside a homeless shelter. EMS was called. No run sheet is available at this time. Triage note is pending. He was initially noted to have hypotension (SBP 80s), bradycardia and hypothermia (rectal temp 83). Of note, the patient was [facility]2017 with a very similar presentation. TSH at that time was 17.4. He was treated with Synthroid and steroids and [facility].   PCCM called for ICU admit.   This patient is critically ill and cannot provide additional history due to altered mental status.   PAST MEDICAL/SURGICAL/SOCIAL/FAMILY HISTORIES   Past Medical History:  Diagnosis Date  . Depression   . 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 Hypothyroidism   . Thyroid disease    "suppose to take RX; don't" (12/17/2017)    Past Surgical History:  Procedure Laterality Date  . NO PAST SURGERIES      Social History   Tobacco Use  . Smoking status: Current Some Day Smoker    Packs/day: 1.00    Years: 34.00    Pack years: 34.00    Types: Cigarettes  . Smokeless tobacco: Former NeurosurgeonUser    Types: Snuff  . Tobacco comment: "used snuff in my teens"  Substance Use Topics  . Alcohol use: Yes    Comment: 12/17/2017  "did drink~ 1/5th vodka qd; quit 5-6 days ago"    No family history on file.   No Known Allergies   Prior to Admission medications   Medication Sig Start Date End Date Taking? Authorizing Provider  levothyroxine (SYNTHROID, LEVOTHROID) 100 MCG tablet Take 1 tablet (100 mcg total) by mouth daily before breakfast. 12/23/17   Briant CedarEzenduka, Nkeiruka J, MD  nicotine (NICODERM CQ - DOSED IN MG/24 HOURS) 14 mg/24hr patch Place 1 patch (14 mg total) onto the skin daily. 12/23/17   Briant CedarEzenduka, Nkeiruka J, MD    Current Facility-Administered Medications  Medication Dose Route Frequency Provider Last Rate Last Dose  . lactated ringers bolus 2,000 mL  2,000 mL Intravenous Once Derwood KaplanNanavati, Ankit, MD       Current Outpatient Medications  Medication Sig Dispense Refill  . levothyroxine (SYNTHROID, LEVOTHROID) 100 MCG tablet Take 1 tablet (100 mcg total) by mouth daily before breakfast. 30 tablet 0  . nicotine (NICODERM CQ - DOSED IN MG/24 HOURS) 14 mg/24hr patch Place 1 patch (14 mg total) onto the skin daily. 30 patch 0     VITAL SIGNS: BP (!) 84/77   Resp (!) 23   SpO2 99%   HEMODYNAMICS:    VENTILATOR SETTINGS: FiO2 (%):  [100 %] 100 % Set Rate:  [20 bmp] 20 bmp PEEP:  [8 cmH20] 8 cmH20  INTAKE / OUTPUT: No intake/output data recorded.  PHYSICAL EXAMINATION: General: chronically ill appearing male  in NAD lying on ER stretcher  HEENT: MM pink/dry, split lower lip which is not new Neuro: lethargic, arouses to noxious stimuli. Strong cough. Strong gag. DTR 1+ @ B biceps and B patellar tendons CV: s1s2 rrr, no m/r/g PULM: even/non-labored, lungs bilaterally  ON:GEXB, non-tender, bsx4 active  Extremities: cool to touch, dry flaky skin, generalized edema  Skin: no rashes or lesions. Multiple tattoos.    LABS:  BMET Recent Labs  Lab 12/22/17 0454 12/28/17 0651  NA 137 136  K 3.0* 3.1*  CL 104 103  CO2 23 23  BUN 14 21*  CREATININE 1.30* 0.99  GLUCOSE 88 113*     Electrolytes Recent Labs  Lab 12/22/17 0454 12/28/17 0651  CALCIUM 9.0 8.6*  MG 1.6* 2.1    CBC Recent Labs  Lab 12/22/17 0454 12/28/17 0651  WBC 7.3 3.0*  HGB 7.7* 9.1*  HCT 22.6* 26.7*  PLT 137* PENDING    Coag's No results for input(s): APTT, INR in the last 168 hours.  Sepsis Markers No results for input(s): LATICACIDVEN, PROCALCITON, O2SATVEN in the last 168 hours.  ABG Recent Labs  Lab 12/28/17 0712  PHART 7.353  PCO2ART 36.9  PO2ART 32.5*    Liver Enzymes No results for input(s): AST, ALT, ALKPHOS, BILITOT, ALBUMIN in the last 168 hours.  Cardiac Enzymes No results for input(s): TROPONINI, PROBNP in the last 168 hours.  Glucose Recent Labs  Lab 12/21/17 2114 12/22/17 0734 12/22/17 1131 12/28/17 0649  GLUCAP 147* 83 85 106*    Imaging No results found.   STUDIES:  -  CULTURES: Results for orders placed or performed during the hospital encounter of 12/16/17  Blood Culture (routine x 2)     Status: None   Collection Time: 12/16/17  8:00 AM  Result Value Ref Range Status   Specimen Description BLOOD RIGHT ANTECUBITAL  Final   Special Requests   Final    BOTTLES DRAWN AEROBIC AND ANAEROBIC Blood Culture adequate volume   Culture NO GROWTH 5 DAYS  Final   Report Status 12/21/2017 FINAL  Final  Blood Culture (routine x 2)     Status: None   Collection Time: 12/16/17  8:10 AM  Result Value Ref Range Status   Specimen Description BLOOD RIGHT FOREARM  Final   Special Requests   Final    BOTTLES DRAWN AEROBIC AND ANAEROBIC Blood Culture adequate volume   Culture NO GROWTH 5 DAYS  Final   Report Status 12/21/2017 FINAL  Final  Urine culture     Status: None   Collection Time: 12/16/17 10:26 AM  Result Value Ref Range Status   Specimen Description URINE, CATHETERIZED  Final   Special Requests NONE  Final   Culture NO GROWTH  Final   Report Status 12/17/2017 FINAL  Final  MRSA PCR Screening     Status: None   Collection Time: 12/16/17   6:28 PM  Result Value Ref Range Status   MRSA by PCR NEGATIVE NEGATIVE Final    Comment:        The GeneXpert MRSA Assay (FDA approved for NASAL specimens only), is one component of a comprehensive MRSA colonization surveillance program. It is not intended to diagnose MRSA infection nor to guide or monitor treatment for MRSA infections.     ANTIBIOTICS: -  SIGNIFICANT EVENTS: -  LINES/TUBES: -   ASSESSMENT / PLAN: Active Problems:   Acute metabolic encephalopathy   Hypothyroidism   Macrocytic anemia   Hypokalemia   Marijuana abuse  Admit to ICU Wean off BiPAP to high-flow cannula Titrate FiO2 to keep SpO2 89-93% Synthroid 50 mcg IV daily until the patient can take PO meds SoluCortef 100 mg IV q 8hr  PULMONARY A: Acute hypoxemic respiratory failure High risk for aspiration, although currently protecting his airway P: Patient appears to be protecting his airway but is currently on BiPAP Albuterol nebs q2 hours PRN CXR now  CARDIOVASCULAR A: Bradycardia Hypotension Hypothermia P: Monitor blood pressure and vitals Continue fluid rehydration at 70ml/hr Check TSH and cortisol May need IV Synthroid and SoluCortef  RENAL A: Hypokalemia  No AKI noted, Serum Cr-0.99 P: Replete potassium   GASTROINTESTINAL A: no acute issues  P: GI prophylaxis   HEMATOLOGIC A: macrocytic anemia P: check B12 and folate  INFECTIOUS A: no acute issues  R/O Infectious Etiology Hypothermia / Hypotension. WBC 5.0 No clear etiology for infection at this time. Will continue to evaluate pending culture results  P:  Culture results as above  ENDOCRINE A: Hypothyroidism - pt reports taking medication but can't state when / how much  R/O Myxedema coma P: Hydrocortisone 100mg  q8 hours  NEUROLOGIC A: Acute Encephalopathy  Marijuana abuse P: neurochecks   FAMILY  - Updates:   - Inter-disciplinary family meet or Palliative Care meeting due by:  day  7   Marcelle Smiling, MD Board Certified by the ABIM, Pulmonary Diseases & Critical Care Medicine  [facility] Pager: [phone]  12/28/2017, 8:42 AM  Cc: 70 min

## 2017-12-28 NOTE — ED Provider Notes (Signed)
[facility]-EMERGENCY DEPT Provider Note   CSN: [id number] Arrival date & time: 12/28/17  [id number]     History   Chief Complaint No chief complaint on file.   HPI Bruce Simpson is a 52 y.o. male.  HPI  52 year old comes in with chief complaint of altered mental status. Level 5 caveat for altered mental status.  Patient has history of hypothyroidism, depression.  EMS was called because patient was found outside, exposing himself.  EMS noted that patient was hypothermic, hypotensive and altered.  Patient was seen in the ER for similar visit earlier in the month, at that time patient was also noted to be hypothyroid.   Past Medical History:  Diagnosis Date  . Depression   . 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 Hypothyroidism   . Thyroid disease    "suppose to take RX; don't" (12/17/2017)    Patient Active Problem List   Diagnosis Date Noted  . Macrocytic anemia 12/28/2017  . Hypokalemia 12/28/2017  . Hypothermia   . Hyponatremia   . Myxedema coma (HCC)   . Hypotension 12/16/2017    Past Surgical History:  Procedure Laterality Date  . NO PAST SURGERIES         Home Medications    Prior to Admission medications   Medication Sig Start Date End Date Taking? Authorizing Provider  levothyroxine (SYNTHROID, LEVOTHROID) 100 MCG tablet Take 1 tablet (100 mcg total) by mouth daily before breakfast. 12/23/17   Briant CedarEzenduka, Nkeiruka J, MD  nicotine (NICODERM CQ - DOSED IN MG/24 HOURS) 14 mg/24hr patch Place 1 patch (14 mg total) onto the skin daily. 12/23/17   Briant CedarEzenduka, Nkeiruka J, MD    Family History No family history on file.  Social History Social History   Tobacco Use  . Smoking status: Current Some Day Smoker    Packs/day: 1.00    Years: 34.00    Pack years: 34.00    Types: Cigarettes  . Smokeless tobacco: Former NeurosurgeonUser    Types: Snuff  . Tobacco comment: "used snuff in  my teens"  Substance Use Topics  . Alcohol use: Yes    Comment: 12/17/2017 "did drink~ 1/5th vodka qd; quit 5-6 days ago"  . Drug use: No     Allergies   Patient has no known allergies.   Review of Systems Review of Systems  Unable to perform ROS: Mental status change     Physical Exam Updated Vital Signs BP (!) 84/77   Resp (!) 23   SpO2 99%   Physical Exam  Constitutional: He appears well-developed.  HENT:  Head: Atraumatic.  Eyes: Pupils are equal, round, and reactive to light.  Neck: Neck supple.  Cardiovascular:  Bradycardia  Pulmonary/Chest: Effort normal. No respiratory distress. He has no wheezes.  Abdominal: Soft.  Musculoskeletal: He exhibits no edema.  Neurological:  GCS is 10 (3/2/5)  Skin: Skin is warm. Rash noted.  Generalized petechial rash over the right groin  Nursing note and vitals reviewed.    ED Treatments / Results  Labs (all labs ordered are listed, but only abnormal results are displayed) Labs Reviewed  RAPID URINE DRUG SCREEN, HOSP PERFORMED - Abnormal; Notable for the following components:      Result Value   Tetrahydrocannabinol POSITIVE (*)    All other components within normal limits  URINALYSIS, ROUTINE W REFLEX MICROSCOPIC - Abnormal; Notable for the following components:  Ketones, ur 5 (*)    Protein, ur 30 (*)    Squamous Epithelial / LPF 0-5 (*)    Non Squamous Epithelial 6-30 (*)    All other components within normal limits  CBC WITH DIFFERENTIAL/PLATELET - Abnormal; Notable for the following components:   WBC 3.0 (*)    RBC 2.61 (*)    Hemoglobin 9.1 (*)    HCT 26.7 (*)    MCV 102.3 (*)    MCH 34.9 (*)    RDW 17.4 (*)    All other components within normal limits  COMPREHENSIVE METABOLIC PANEL - Abnormal; Notable for the following components:   Potassium 3.1 (*)    Glucose, Bld 113 (*)    BUN 21 (*)    Calcium 8.6 (*)    Total Protein 6.2 (*)    Albumin 3.4 (*)    AST 100 (*)    ALT 65 (*)    All other  components within normal limits  CK - Abnormal; Notable for the following components:   Total CK 985 (*)    All other components within normal limits  TSH - Abnormal; Notable for the following components:   TSH 15.442 (*)    All other components within normal limits  ACETAMINOPHEN LEVEL - Abnormal; Notable for the following components:   Acetaminophen (Tylenol), Serum <10 (*)    All other components within normal limits  BLOOD GAS, ARTERIAL - Abnormal; Notable for the following components:   pO2, Arterial 32.5 (*)    Acid-base deficit 4.0 (*)    All other components within normal limits  CBG MONITORING, ED - Abnormal; Notable for the following components:   Glucose-Capillary 106 (*)    All other components within normal limits  TROPONIN I  MAGNESIUM  ETHANOL  SALICYLATE LEVEL  PROTIME-INR  APTT  T3, FREE  T4, FREE  I-STAT CG4 LACTIC ACID, ED    EKG  EKG Interpretation  Date/Time:  Sunday December 28 2017 07:55:23 EST Ventricular Rate:  54 PR Interval:    QRS Duration: 137 QT Interval:  572 QTC Calculation: 543 R Axis:   88 Text Interpretation:  Sinus rhythm Nonspecific intraventricular conduction delay Abnormal inferior Q waves qtc prolongation Confirmed by Derwood Kaplan ([id number]) on 12/28/2017 8:06:20 AM Also confirmed by Derwood Kaplan [phone]), editor Barbette Hair [phone])  on 12/28/2017 8:25:36 AM       Radiology No results found.  Procedures Procedures (including critical care time)  CRITICAL CARE Performed by: Dawayne Ohair   Total critical care time: 38 minutes  Critical care time was exclusive of separately billable procedures and treating other patients.  Critical care was necessary to treat or prevent imminent or life-threatening deterioration.  Critical care was time spent personally by me on the following activities: development of treatment plan with patient and/or surrogate as well as nursing, discussions with consultants, evaluation of patient's  response to treatment, examination of patient, obtaining history from patient or surrogate, ordering and performing treatments and interventions, ordering and review of laboratory studies, ordering and review of radiographic studies, pulse oximetry and re-evaluation of patient's condition.   Medications Ordered in ED Medications  lactated ringers bolus 2,000 mL (not administered)  lactated ringers bolus 1,000 mL (not administered)     Initial Impression / Assessment and Plan / ED Course  I have reviewed the triage vital signs and the nursing notes.  Pertinent labs & imaging results that were available during my care of the patient were reviewed by me and  considered in my medical decision making (see chart for details).  Clinical Course as of Dec 28 825  Wynelle Link Dec 28, 2017  0826 Patient's TSH is greater than 15. CCM aware, and will manage. TSH: (!) 15.442 [AN]    Clinical Course User Index [AN] Derwood Kaplan, MD   52 year old male comes into the ER with chief complaint of altered mental status.  Patient is noted to be hypothermic, hypotensive and hypoxic at arrival.  Patient's core temperature is less than 29 C.  For the moderately severe hypothermia we will start both active and passive warming.  ABG ordered, and patient's PO2 and O2 sats are low.  We will place patient on BiPAP, trying to avoid intubating given that he is protecting his airway.  Patient's O2 sats have responded to BiPAP, and so far he has been tolerating that well.  No signs of obvious trauma to the head, CT had not ordered.  Lung exam and abdominal exam are reassuring.  Patient was hospitalized recently for similar complaints.  At that time patient required multiple boluses of IV fluids, pressors, IV thyroid and steroids.  Patient's blood pressure is borderline low at this time, 4 L of LR has been ordered.  Appropriate labs have been ordered as well.  CM to admit patient.  Patient is to IVs, and since no pressors are  required at this time we will not place any central line.  Final Clinical Impressions(s) / ED Diagnoses   Final diagnoses:  Hypothermic shock, initial encounter  Encephalopathy acute  Myxedema coma [facility])    ED Discharge Orders    None       Derwood Kaplan, MD 12/28/17 [phone]

## 2017-12-28 NOTE — ED Notes (Signed)
Bed: [id number] Expected date:  Expected time:  Means of arrival:  Comments: EMS-Hypothermic- combative

## 2017-12-28 NOTE — ED Notes (Signed)
ED TO INPATIENT HANDOFF REPORT  Name/Age/Gender Bruce Simpson 52 y.o. male  Code Status    Code Status Orders  (From admission, onward)        Start     Ordered   12/28/17 0834  Full code  Continuous     12/28/17 0837    Code Status History    Date Active Date Inactive Code Status Order ID Comments User Context   12/16/2017 14:47 12/22/2017 20:34 Full Code [id number]  Donita Brooks, NP ED      Home/SNF/Other homeless  Chief Complaint No admission diagnoses are documented for this encounter.  Level of Care/Admitting Diagnosis ED Disposition    ED Disposition Condition [facility] Area: [facility] [[id number]]  Level of Care: ICU [6]  Diagnosis: Hypothermia [800349]  Admitting Physician: Renee Pain [[id number]]  Attending Physician: Renee Pain [[id number]]  Estimated length of stay: 3 - 4 days  Certification:: I certify this patient will need inpatient services for at least 2 midnights  PT Class (Do Not Modify): Inpatient [101]  PT Acc Code (Do Not Modify): Private [1]       Medical History Past Medical History:  Diagnosis Date  . Depression   . 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 Hypothyroidism   . Thyroid disease    "suppose to take RX; don't" (12/17/2017)    Allergies No Known Allergies  IV Location/Drains/Wounds Patient Lines/Drains/Airways Status   Active Line/Drains/Airways    None          Labs/Imaging Results for orders placed or performed during the hospital encounter of 12/28/17 (from the past 48 hour(s))  CBG monitoring, ED     Status: Abnormal   Collection Time: 12/28/17  6:49 AM  Result Value Ref Range   Glucose-Capillary 106 (H) 65 - 99 mg/dL  Urine rapid drug screen (hosp performed)     Status: Abnormal   Collection Time: 12/28/17  6:51 AM  Result Value Ref Range   Opiates NONE DETECTED NONE DETECTED   Cocaine NONE  DETECTED NONE DETECTED   Benzodiazepines NONE DETECTED NONE DETECTED   Amphetamines NONE DETECTED NONE DETECTED   Tetrahydrocannabinol POSITIVE (A) NONE DETECTED   Barbiturates NONE DETECTED NONE DETECTED    Comment: (NOTE) DRUG SCREEN FOR MEDICAL PURPOSES ONLY.  IF CONFIRMATION IS NEEDED FOR ANY PURPOSE, NOTIFY LAB WITHIN 5 DAYS. LOWEST DETECTABLE LIMITS FOR URINE DRUG SCREEN Drug Class                     Cutoff (ng/mL) Amphetamine and metabolites    1000 Barbiturate and metabolites    200 Benzodiazepine                 165 Tricyclics and metabolites     300 Opiates and metabolites        300 Cocaine and metabolites        300 THC                            50   Urinalysis, Routine w reflex microscopic     Status: Abnormal   Collection Time: 12/28/17  6:51 AM  Result Value Ref Range   Color, Urine YELLOW YELLOW   APPearance CLEAR CLEAR   Specific Gravity, Urine 1.019 1.005 - 1.030   pH 5.0 5.0 - 8.0  Glucose, UA NEGATIVE NEGATIVE mg/dL   Hgb urine dipstick NEGATIVE NEGATIVE   Bilirubin Urine NEGATIVE NEGATIVE   Ketones, ur 5 (A) NEGATIVE mg/dL   Protein, ur 30 (A) NEGATIVE mg/dL   Nitrite NEGATIVE NEGATIVE   Leukocytes, UA NEGATIVE NEGATIVE   RBC / HPF 0-5 0 - 5 RBC/hpf   WBC, UA 0-5 0 - 5 WBC/hpf   Bacteria, UA NONE SEEN NONE SEEN   Squamous Epithelial / LPF 0-5 (A) NONE SEEN   Mucus PRESENT    Hyaline Casts, UA PRESENT    Granular Casts, UA PRESENT    Non Squamous Epithelial 6-30 (A) NONE SEEN  CBC with Differential/Platelet     Status: Abnormal   Collection Time: 12/28/17  6:51 AM  Result Value Ref Range   WBC 3.0 (L) 4.0 - 10.5 K/uL   RBC 2.61 (L) 4.22 - 5.81 MIL/uL   Hemoglobin 9.1 (L) 13.0 - 17.0 g/dL   HCT 26.7 (L) 39.0 - 52.0 %   MCV 102.3 (H) 78.0 - 100.0 fL   MCH 34.9 (H) 26.0 - 34.0 pg   MCHC 34.1 30.0 - 36.0 g/dL   RDW 17.4 (H) 11.5 - 15.5 %   Platelets 100 (L) 150 - 400 K/uL    Comment: RESULT REPEATED AND VERIFIED SPECIMEN CHECKED FOR  CLOTS PLATELET COUNT CONFIRMED BY SMEAR    Neutrophils Relative % 83 %   Lymphocytes Relative 11 %   Monocytes Relative 6 %   Eosinophils Relative 0 %   Basophils Relative 0 %   Neutro Abs 2.5 1.7 - 7.7 K/uL   Lymphs Abs 0.3 (L) 0.7 - 4.0 K/uL   Monocytes Absolute 0.2 0.1 - 1.0 K/uL   Eosinophils Absolute 0.0 0.0 - 0.7 K/uL   Basophils Absolute 0.0 0.0 - 0.1 K/uL   WBC Morphology INCREASED BANDS (>20% BANDS)   Protime-INR     Status: None   Collection Time: 12/28/17  6:51 AM  Result Value Ref Range   Prothrombin Time 12.5 11.4 - 15.2 seconds   INR 0.94   APTT     Status: Abnormal   Collection Time: 12/28/17  6:51 AM  Result Value Ref Range   aPTT 38 (H) 24 - 36 seconds    Comment:        IF BASELINE aPTT IS ELEVATED, SUGGEST PATIENT RISK ASSESSMENT BE USED TO DETERMINE APPROPRIATE ANTICOAGULANT THERAPY.   Comprehensive metabolic panel     Status: Abnormal   Collection Time: 12/28/17  6:51 AM  Result Value Ref Range   Sodium 136 135 - 145 mmol/L   Potassium 3.1 (L) 3.5 - 5.1 mmol/L   Chloride 103 101 - 111 mmol/L   CO2 23 22 - 32 mmol/L   Glucose, Bld 113 (H) 65 - 99 mg/dL   BUN 21 (H) 6 - 20 mg/dL   Creatinine, Ser 0.99 0.61 - 1.24 mg/dL   Calcium 8.6 (L) 8.9 - 10.3 mg/dL   Total Protein 6.2 (L) 6.5 - 8.1 g/dL   Albumin 3.4 (L) 3.5 - 5.0 g/dL   AST 100 (H) 15 - 41 U/L   ALT 65 (H) 17 - 63 U/L   Alkaline Phosphatase 116 38 - 126 U/L   Total Bilirubin 0.6 0.3 - 1.2 mg/dL   GFR calc non Af Amer >60 >60 mL/min   GFR calc Af Amer >60 >60 mL/min    Comment: (NOTE) The eGFR has been calculated using the CKD EPI equation. This calculation has not been validated in  all clinical situations. eGFR's persistently <60 mL/min signify possible Chronic Kidney Disease.    Anion gap 10 5 - 15  Troponin I     Status: None   Collection Time: 12/28/17  6:51 AM  Result Value Ref Range   Troponin I <0.03 <0.03 ng/mL  Magnesium     Status: None   Collection Time: 12/28/17  6:51 AM   Result Value Ref Range   Magnesium 2.1 1.7 - 2.4 mg/dL  CK     Status: Abnormal   Collection Time: 12/28/17  6:51 AM  Result Value Ref Range   Total CK 985 (H) 49 - 397 U/L  Ethanol     Status: None   Collection Time: 12/28/17  6:52 AM  Result Value Ref Range   Alcohol, Ethyl (B) <10 <10 mg/dL    Comment:        LOWEST DETECTABLE LIMIT FOR SERUM ALCOHOL IS 10 mg/dL FOR MEDICAL PURPOSES ONLY   TSH     Status: Abnormal   Collection Time: 12/28/17  6:52 AM  Result Value Ref Range   TSH 15.442 (H) 0.350 - 4.500 uIU/mL    Comment: Performed by a 3rd Generation assay with a functional sensitivity of <=0.01 uIU/mL.  Acetaminophen level     Status: Abnormal   Collection Time: 12/28/17  6:52 AM  Result Value Ref Range   Acetaminophen (Tylenol), Serum <10 (L) 10 - 30 ug/mL    Comment:        THERAPEUTIC CONCENTRATIONS VARY SIGNIFICANTLY. A RANGE OF 10-30 ug/mL MAY BE AN EFFECTIVE CONCENTRATION FOR MANY PATIENTS. HOWEVER, SOME ARE BEST TREATED AT CONCENTRATIONS OUTSIDE THIS RANGE. ACETAMINOPHEN CONCENTRATIONS >150 ug/mL AT 4 HOURS AFTER INGESTION AND >50 ug/mL AT 12 HOURS AFTER INGESTION ARE OFTEN ASSOCIATED WITH TOXIC REACTIONS.   Salicylate level     Status: None   Collection Time: 12/28/17  6:52 AM  Result Value Ref Range   Salicylate Lvl <8.0 2.8 - 30.0 mg/dL  Blood gas, arterial     Status: Abnormal   Collection Time: 12/28/17  7:12 AM  Result Value Ref Range   FIO2 100.00    pH, Arterial 7.353 7.350 - 7.450   pCO2 arterial 36.9 32.0 - 48.0 mmHg   pO2, Arterial 32.5 (LL) 83.0 - 108.0 mmHg    Comment: CRITICAL RESULT CALLED TO, READ BACK BY AND VERIFIED WITH: LILYBETH RN BY ROBIN POWELL RRT AT 0714 ON 12/28/17    Bicarbonate 22.7 20.0 - 28.0 mmol/L   Acid-base deficit 4.0 (H) 0.0 - 2.0 mmol/L   O2 Saturation 77.4 %   Patient temperature 84.2    Collection site RIGHT RADIAL    Drawn by [id number]    Sample type ARTERIAL DRAW    Allens test (pass/fail) PASS PASS   No  results found.  Pending Labs Unresulted Labs (From admission, onward)   Start     Ordered   12/29/17 0500  CBC  Tomorrow morning,   R     12/28/17 0837   12/29/17 9150  Basic metabolic panel  Tomorrow morning,   R     12/28/17 0837   12/29/17 0500  Blood gas, arterial  Tomorrow morning,   R     12/28/17 0837   12/29/17 0500  Magnesium  Tomorrow morning,   R     12/28/17 0837   12/28/17 1000  Blood gas, arterial  Once,   R     12/28/17 0841   12/28/17 0837  Vitamin B12  Once,   R     12/28/17 0837   12/28/17 0837  Folate RBC  Once,   R     12/28/17 0837   12/28/17 0834  CBC  (heparin)  Once,   R    Comments:  Baseline for heparin therapy IF NOT ALREADY DRAWN.  Notify MD if PLT < 100 K.    12/28/17 0837   12/28/17 0834  Creatinine, serum  (heparin)  Once,   R    Comments:  Baseline for heparin therapy IF NOT ALREADY DRAWN.    12/28/17 0837   12/28/17 0831  Cortisol  STAT,   R     12/28/17 0830   12/28/17 0646  T3, free  STAT,   STAT     12/28/17 0645   12/28/17 0646  T4, free  STAT,   STAT     12/28/17 0645      Vitals/Pain Today's Vitals   12/28/17 0726 12/28/17 0742  BP: (!) 84/77   Resp: (!) 23   SpO2:  99%    Isolation Precautions No active isolations  Medications Medications  lactated ringers bolus 1,000 mL (1,000 mLs Intravenous New Bag/Given 12/28/17 0856)  0.9 %  sodium chloride infusion (not administered)  heparin injection 5,000 Units (not administered)  famotidine (PEPCID) IVPB 20 mg premix (not administered)  ipratropium-albuterol (DUONEB) 0.5-2.5 (3) MG/3ML nebulizer solution 3 mL (not administered)  dextrose 5 % and 0.45 % NaCl with KCl 40 mEq/L infusion (not administered)  potassium chloride 10 mEq in 100 mL IVPB (not administered)  levothyroxine (SYNTHROID, LEVOTHROID) injection 50 mcg (not administered)  hydrocortisone sodium succinate (SOLU-CORTEF) 100 MG injection 100 mg (not administered)  lactated ringers bolus 2,000 mL (2,000 mLs  Intravenous New Bag/Given 12/28/17 0700)    Mobility walks

## 2017-12-29 ENCOUNTER — Encounter (HOSPITAL_COMMUNITY): Payer: Self-pay

## 2017-12-29 ENCOUNTER — Inpatient Hospital Stay (HOSPITAL_COMMUNITY): Payer: Self-pay

## 2017-12-29 DIAGNOSIS — G9341 Metabolic encephalopathy: Secondary | ICD-10-CM

## 2017-12-29 DIAGNOSIS — J9601 Acute respiratory failure with hypoxia: Secondary | ICD-10-CM

## 2017-12-29 DIAGNOSIS — T68XXXA Hypothermia, initial encounter: Secondary | ICD-10-CM

## 2017-12-29 LAB — BASIC METABOLIC PANEL
ANION GAP: 6 (ref 5–15)
BUN: 14 mg/dL (ref 6–20)
CALCIUM: 8.2 mg/dL — AB (ref 8.9–10.3)
CO2: 24 mmol/L (ref 22–32)
CREATININE: 0.97 mg/dL (ref 0.61–1.24)
Chloride: 107 mmol/L (ref 101–111)
GFR calc Af Amer: >60 mL/min (ref >60)
Glucose, Bld: 100 mg/dL — ABNORMAL HIGH (ref 65–99)
Potassium: 3.4 mmol/L — ABNORMAL LOW (ref 3.5–5.1)
Sodium: 137 mmol/L (ref 135–145)

## 2017-12-29 LAB — BLOOD GAS, ARTERIAL
Acid-base deficit: 0.2 mmol/L (ref 0.0–2.0)
Bicarbonate: 24.7 mmol/L (ref 20.0–28.0)
DELIVERY SYSTEMS: POSITIVE
Drawn by: 422461
Expiratory PAP: 6
FIO2: 50
Inspiratory PAP: 18
MODE: POSITIVE
O2 Saturation: 97.1 %
Patient temperature: 97
RATE: 12 resp/min
pCO2 arterial: 43 mmHg (ref 32.0–48.0)
pH, Arterial: 7.372 (ref 7.350–7.450)
pO2, Arterial: 92.3 mmHg (ref 83.0–108.0)

## 2017-12-29 LAB — CBC
HEMATOCRIT: 21.6 % — AB (ref 39.0–52.0)
Hemoglobin: 7.3 g/dL — ABNORMAL LOW (ref 13.0–17.0)
MCH: 35.1 pg — ABNORMAL HIGH (ref 26.0–34.0)
MCHC: 33.8 g/dL (ref 30.0–36.0)
MCV: 103.8 fL — AB (ref 78.0–100.0)
PLATELETS: 150 10*3/uL (ref 150–400)
RBC: 2.08 MIL/uL — ABNORMAL LOW (ref 4.22–5.81)
RDW: 18.1 % — AB (ref 11.5–15.5)
WBC: 6.4 10*3/uL (ref 4.0–10.5)

## 2017-12-29 LAB — T3, FREE: T3 FREE: 1.1 pg/mL — AB (ref 2.0–4.4)

## 2017-12-29 LAB — FOLATE RBC
FOLATE, HEMOLYSATE: 301.4 ng/mL
FOLATE, RBC: 1435 ng/mL (ref >498)
Hematocrit: 21 % — ABNORMAL LOW (ref 37.5–51.0)

## 2017-12-29 LAB — MAGNESIUM: MAGNESIUM: 1.9 mg/dL (ref 1.7–2.4)

## 2017-12-29 MED ORDER — LEVOTHYROXINE SODIUM 100 MCG PO TABS
100.0000 ug | ORAL_TABLET | Freq: Every day | ORAL | Status: DC
  Administered 2017-12-30 – 2018-01-05 (×7): 100 ug via ORAL
  Filled 2017-12-29 (×7): qty 1

## 2017-12-29 MED ORDER — SODIUM CHLORIDE 0.9 % IV SOLN
3.0000 g | Freq: Four times a day (QID) | INTRAVENOUS | Status: DC
  Administered 2017-12-29 – 2017-12-30 (×4): 3 g via INTRAVENOUS
  Filled 2017-12-29 (×5): qty 3

## 2017-12-29 NOTE — Progress Notes (Signed)
Pt took off BIPAP and placed on 4LPM  [place]. No distress noted at this time. 

## 2017-12-29 NOTE — Care Management Note (Signed)
Case Management Note  Patient Details  Name: Bruce Simpson MRN: [id number] Date of Birth: Apr 21, 1966  Subjective/Objective:                  hypothermia and altered mental status (combative). At the time of clinical interview, the patient is somnolent but arousable. He is on a BiPAP, laying flat and appears to be able to protect his airway    Action/Plan: Date:  December 29, 2017 Chart reviewed for concurrent status and case management needs.  Will continue to follow patient progress.  Discharge Planning: following for needs.  None present at this time of review. Expected discharge date: January 01, 2018 Marcelle SmilingRhonda Davis, BSN, [place]RN3, [place]CCM   [phone]  Expected Discharge Date:                  Expected Discharge Plan:  Home/Self Care  In-House Referral:     Discharge planning Services  CM Consult  Post Acute Care Choice:    Choice offered to:     DME Arranged:    DME Agency:     HH Arranged:    HH Agency:     Status of Service:  In process, will continue to follow  If discussed at Long Length of Stay Meetings, dates discussed:    Additional Comments:  Golda AcreDavis, Rhonda Lynn, RN 12/29/2017, 9:52 AM

## 2017-12-29 NOTE — Progress Notes (Signed)
Pt placed back on BiPAP

## 2017-12-29 NOTE — Progress Notes (Signed)
[facility] Pulmonary & Critical Care Medicine   Patient Name: Bruce Simpson MRN: [id number] DOB: Mar 24, 1966    ADMISSION DATE:  12/28/2017 DATE OF SERVICE:  12/28/2017  CHIEF COMPLAINT:  Altered mental status, hypothermia, hypotension   HISTORY OF PRESENT ILLNESS   52 y.o. obese Caucasian male presented to [facility] with hypothermia and altered mental status (combative). At the time of clinical interview, the patient is somnolent but arousable. He is on a BiPAP, laying flat and appears to be able to protect his airway. However, he does not participate in clinical interview.   He was reportedly found masturbating outside a homeless shelter. EMS was called. No run sheet is available at this time. Triage note is pending. He was initially noted to have hypotension (SBP 80s), bradycardia and hypothermia (rectal temp 83). Of note, the patient was seen at [facility] ER on 12/16/2017 with a very similar presentation. TSH at that time was 17.4. He was treated with Synthroid and steroids and was discharged from [facility] on 12/22/2017.   PCCM called for ICU admit.   SUBJECTIVE:  Pt remains on BiPAP.  PC12/6, 50%. No acute events overnight.    VITAL SIGNS: BP 104/74   Pulse 77   Temp 98.4 F (36.9 C)   Resp 12   Wt 222 lb 10.6 oz (101 kg)   SpO2 97%   BMI 29.38 kg/m   HEMODYNAMICS:    VENTILATOR SETTINGS: Vent Mode: PCV;BIPAP FiO2 (%):  [50 %-100 %] 50 % Set Rate:  [10 bmp-20 bmp] 10 bmp PEEP:  [6 cmH20-8 cmH20] 6 cmH20 Pressure Support:  [12 cmH20] 12 cmH20  INTAKE / OUTPUT: I/O last 3 completed shifts: In: 1575 [I.V.:1475; IV Piggyback:100] Out: 1950 [Urine:1950]  PHYSICAL EXAMINATION: General: chronically ill appearing male in NAD on BiPAP HEENT: MM pink/moist, BiPAP mask in place PSY: calm  Neuro: AAOx4, speech clear, MAE CV: s1s2 rrr, no m/r/g PULM: even/non-labored, lungs bilaterally coarse  GI: soft, non-tender, bsx4 active  Extremities: warm/dry, no  edema  Skin: no rashes or lesions  LABS:  BMET Recent Labs  Lab 12/28/17 0651 12/29/17 0330  NA 136 137  K 3.1* 3.4*  CL 103 107  CO2 23 24  BUN 21* 14  CREATININE 0.99 0.97  GLUCOSE 113* 100*    Electrolytes Recent Labs  Lab 12/28/17 0651 12/29/17 0330  CALCIUM 8.6* 8.2*  MG 2.1 1.9    CBC Recent Labs  Lab 12/28/17 0651 12/29/17 0330  WBC 3.0* 6.4  HGB 9.1* 7.3*  HCT 26.7* 21.6*  PLT 100* 150    Coag's Recent Labs  Lab 12/28/17 0651  APTT 38*  INR 0.94    Sepsis Markers No results for input(s): LATICACIDVEN, PROCALCITON, O2SATVEN in the last 168 hours.  ABG Recent Labs  Lab 12/28/17 0712 12/28/17 1012 12/29/17 0458  PHART 7.353 7.362 7.372  PCO2ART 36.9 39.4 43.0  PO2ART 32.5* 47.5* 92.3    Liver Enzymes Recent Labs  Lab 12/28/17 0651  AST 100*  ALT 65*  ALKPHOS 116  BILITOT 0.6  ALBUMIN 3.4*    Cardiac Enzymes Recent Labs  Lab 12/28/17 0651  TROPONINI <0.03    Glucose Recent Labs  Lab 12/22/17 1131 12/28/17 0649  GLUCAP 85 106*    Imaging Dg Chest Port 1 View  Result Date: 12/29/2017 CLINICAL DATA:  Hypoxia. EXAM: PORTABLE CHEST 1 VIEW COMPARISON:  12/28/2017 FINDINGS: Shallow inspiration. Atelectasis or infiltration in the lung bases. Small bilateral pleural effusions. Cardiac enlargement without  vascular congestion. No edema. Emphysematous changes in the upper lungs. Mediastinal contours appear intact. IMPRESSION: Shallow inspiration with atelectasis or infiltration in the lung bases and probable small bilateral pleural effusions. Findings are similar to previous study. Bilateral pneumonia could have this appearance. Electronically Signed   By: Burman NievesWilliam  Stevens M.D.   On: 12/29/2017 05:21   Dg Chest Port 1 View  Result Date: 12/28/2017 CLINICAL DATA:  Shortness of Breath EXAM: PORTABLE CHEST 1 VIEW COMPARISON:  December 19, 2017 FINDINGS: There is evident bullous disease in the upper lobes, more notable on the right than  on the left. There is airspace consolidation in both lung bases with small pleural effusions bilaterally. Lungs elsewhere are clear. Heart is mildly enlarged with pulmonary vascularity reflecting underlying bullous disease in the upper lobes. No adenopathy. No bone lesions. 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 Airspace consolidation both lower lobes with small pleural effusions. Suspect bilateral pneumonia, although pulmonary edema could present as well in this manner. Both entities may exist concurrently. Stable cardiac silhouette.  Pulmonary vascularity remains stable. Electronically Signed   By: Bretta BangWilliam  Woodruff III M.D.   On: 12/28/2017 09:40     STUDIES:    CULTURES:   ANTIBIOTICS:   SIGNIFICANT EVENTS: 1/20  Admit   LINES/TUBES:   ASSESSMENT / PLAN:  PULMONARY A:  Acute hypoxemic respiratory failure High risk for aspiration, although currently protecting his airway P:  O2 to support sats > 90% PRN BiPAP for increased work of breathing  Albuterol PRN  Follow intermittent CXR, basilar changes on XRAY > ? atx + small effusions  CARDIOVASCULAR A:  Bradycardia Hypotension Hypothermia P:  Monitor in SDU  Reduce D51/2NS with 40 mEq KCL @ 50 ml/hr  Warming blanket as needed   RENAL A:  Hypokalemia  No AKI noted, Serum Cr-0.99 P:  Trend BMP / urinary output Replace electrolytes as indicated Avoid nephrotoxic agents, ensure adequate renal perfusion  GASTROINTESTINAL A:  No acute issues  P:  Pepcid for SUP NPO   HEMATOLOGIC A:  Macrocytic anemia - B12 2220 P: Await folate  Trend CBC  Heparin SQ for DVT prophylaxis  INFECTIOUS A:   R/O Infectious Etiology Hypothermia / Hypotension. WBC 5.0 No clear etiology for infection at this time. Will continue to evaluate pending culture results  P:   Follow cultures as above Doubt infectious etiology of presentation   ENDOCRINE A: Hypothyroidism - pt reports  taking medication but can't state when / how much, TSH 15.4 (down from 17.4 on 1/8) R/O Myxedema coma P:  Continue synthroid 50 mcg IV QD Hydrocortisone 100 mg IV Q8   NEUROLOGIC A:  Acute Encephalopathy  Marijuana abuse P:  Resolving, supportive care SW consult > will need information regarding homeless shelter OOB to chair   FAMILY  - Updates: No family at bedside.    - Inter-disciplinary family meet or Palliative Care meeting due by:  day 7   Canary BrimBrandi Tasheba Henson, NP-C [facility] Pgr: [phone] or if no answer [phone] 12/29/2017, 9:25 AM

## 2017-12-29 NOTE — Progress Notes (Signed)
Pharmacy Antibiotic Note  Elana AlmBrian Edwin Behring is a 52 y.o. male admitted on 12/28/2017 with aspiration pneumonia.  Pharmacy has been consulted for Unasyn dosing.  Plan: Unasyn 3g IV q6h. Renal function WNL and stable. Need for further dosage adjustment appears unlikely at present.  Pharmacy will sign off at this time.  Please reconsult if a change in clinical status warrants re-evaluation of dosage.  Weight: 222 lb 10.6 oz (101 kg)  Temp (24hrs), Avg:97.4 F (36.3 C), Min:92.3 F (33.5 C), Max:98.8 F (37.1 C)  Recent Labs  Lab 12/28/17 0651 12/29/17 0330  WBC 3.0* 6.4  CREATININE 0.99 0.97    Estimated Creatinine Clearance: 115.1 mL/min (by C-G formula based on SCr of 0.97 mg/dL).    No Known Allergies  Antimicrobials this admission: 1/21 Unasyn >>   Microbiology results: 1/20 MRSA screen neg  Thank you for allowing pharmacy to be a part of this patient's care.  Clance BollRunyon, Falana Clagg 12/29/2017 12:11 PM

## 2017-12-30 DIAGNOSIS — G9340 Encephalopathy, unspecified: Secondary | ICD-10-CM

## 2017-12-30 DIAGNOSIS — D539 Nutritional anemia, unspecified: Secondary | ICD-10-CM

## 2017-12-30 DIAGNOSIS — J690 Pneumonitis due to inhalation of food and vomit: Principal | ICD-10-CM

## 2017-12-30 LAB — CBC
HEMATOCRIT: 20.6 % — AB (ref 39.0–52.0)
HEMOGLOBIN: 7 g/dL — AB (ref 13.0–17.0)
MCH: 35.2 pg — ABNORMAL HIGH (ref 26.0–34.0)
MCHC: 34 g/dL (ref 30.0–36.0)
MCV: 103.5 fL — ABNORMAL HIGH (ref 78.0–100.0)
Platelets: 139 10*3/uL — ABNORMAL LOW (ref 150–400)
RBC: 1.99 MIL/uL — AB (ref 4.22–5.81)
RDW: 19 % — ABNORMAL HIGH (ref 11.5–15.5)
WBC: 7.7 10*3/uL (ref 4.0–10.5)

## 2017-12-30 LAB — PREPARE RBC (CROSSMATCH)

## 2017-12-30 LAB — BASIC METABOLIC PANEL
Anion gap: 4 — ABNORMAL LOW (ref 5–15)
BUN: 18 mg/dL (ref 6–20)
CHLORIDE: 108 mmol/L (ref 101–111)
CO2: 26 mmol/L (ref 22–32)
Calcium: 8.6 mg/dL — ABNORMAL LOW (ref 8.9–10.3)
Creatinine, Ser: 0.98 mg/dL (ref 0.61–1.24)
GFR calc non Af Amer: >60 mL/min (ref >60)
Glucose, Bld: 97 mg/dL (ref 65–99)
Potassium: 3.5 mmol/L (ref 3.5–5.1)
SODIUM: 138 mmol/L (ref 135–145)

## 2017-12-30 LAB — RETICULOCYTES
RBC.: 2.16 MIL/uL — ABNORMAL LOW (ref 4.22–5.81)
Retic Count, Absolute: 75.6 10*3/uL (ref 19.0–186.0)
Retic Ct Pct: 3.5 % — ABNORMAL HIGH (ref 0.4–3.1)

## 2017-12-30 LAB — IRON AND TIBC
Iron: 26 ug/dL — ABNORMAL LOW (ref 45–182)
SATURATION RATIOS: 11 % — AB (ref 17.9–39.5)
TIBC: 246 ug/dL — AB (ref 250–450)
UIBC: 220 ug/dL

## 2017-12-30 LAB — FOLATE: FOLATE: 3.1 ng/mL — AB (ref >5.9)

## 2017-12-30 LAB — ABO/RH: ABO/RH(D): O NEG

## 2017-12-30 LAB — VITAMIN B12: VITAMIN B 12: 2791 pg/mL — AB (ref 180–914)

## 2017-12-30 LAB — FERRITIN: Ferritin: 146 ng/mL (ref 24–336)

## 2017-12-30 MED ORDER — FAMOTIDINE 20 MG PO TABS
20.0000 mg | ORAL_TABLET | Freq: Two times a day (BID) | ORAL | Status: DC
  Administered 2017-12-30 – 2018-01-05 (×13): 20 mg via ORAL
  Filled 2017-12-30 (×13): qty 1

## 2017-12-30 MED ORDER — SODIUM CHLORIDE 0.9 % IV SOLN
Freq: Once | INTRAVENOUS | Status: AC
  Administered 2017-12-30: 12:00:00 via INTRAVENOUS

## 2017-12-30 MED ORDER — NICOTINE 21 MG/24HR TD PT24
21.0000 mg | MEDICATED_PATCH | Freq: Every day | TRANSDERMAL | Status: DC
  Administered 2017-12-30 – 2018-01-05 (×7): 21 mg via TRANSDERMAL
  Filled 2017-12-30 (×7): qty 1

## 2017-12-30 MED ORDER — AMOXICILLIN-POT CLAVULANATE 875-125 MG PO TABS
1.0000 | ORAL_TABLET | Freq: Two times a day (BID) | ORAL | Status: AC
  Administered 2017-12-30 – 2018-01-04 (×12): 1 via ORAL
  Filled 2017-12-30 (×12): qty 1

## 2017-12-30 NOTE — Progress Notes (Signed)
[facility] PROGRESS NOTE    Progress Note  Bruce Simpson  ZOX:[id number] DOB: 28-Sep-1966 DOA: 12/28/2017 PCP: Patient, No Pcp Per     Brief Narrative:   Bruce Simpson is an 52 y.o. male was brought in to the [facility] for hypothermia and altered mental status and combative, as per records he was found to be masturbating outside homeless shelter EMS was called, in the ED he was found to be hypotensive and hypothermic, he was seen on 12/16/2017 with a similar complaint at: Hospital, during this time his TSH was 17 he was treated with IV steroids and Synthroid and discharged on 12/22/2017  Assessment/Plan:   Acute hypoxemic respiratory failure possibly due to aspiration pneumonia: Placed on BiPAP due to work of breathing, chest x-ray showed small effusion. This time his saturations have remained greater than 90% on 2 L of oxygen. Chest x-ray showed bilateral infiltrates he was started on IV Unasyn we will change him to oral Augmentin. Discontinue IV fluids.  Hypothermia/bradycardia and hypotension: He was treated conservatively with IV fluids and a bear hugger.  His temperature has improved. Pressure is also improved the symptoms are all likely related to his hypothermia. Likely due to cold exposure, on admission his alcohol level was less than 10, but he does have a history of alcohol abuse, is reflected in his CBC that shows microcytic anemia, leukopenia and thrombocytopenia.  Acute metabolic encephalopathy UDS positive for marijuana now resolved.  Macrocytic anemia Hemoglobin now 7.0 go ahead and transfuse him 1 unit of packed red blood cells.  Check a CBC post transfusional check an anemia panel.  Hypothyroidism Tinea Synthroid.  Marijuana abuse When confronted about his cannabis use he denies it, when told his UDS was positive for marijuana he admits it. I am not sure how reliably he is. He is able to make medical decisions.  DVT prophylaxis: lovenox Family  Communication:none Disposition Plan/Barrier to D/C: home in am Code Status:     Code Status Orders  (From admission, onward)        Start     Ordered   12/28/17 0834  Full code  Continuous     12/28/17 0837    Code Status History    Date Active Date Inactive Code Status Order ID Comments User Context   12/16/2017 14:47 12/22/2017 20:34 Full Code [id number]  Jeanella Craze, NP ED        IV Access:    Peripheral IV   Procedures and diagnostic studies:   Dg Chest Port 1 View  Result Date: 12/29/2017 CLINICAL DATA:  Hypoxia. EXAM: PORTABLE CHEST 1 VIEW COMPARISON:  12/28/2017 FINDINGS: Shallow inspiration. Atelectasis or infiltration in the lung bases. Small bilateral pleural effusions. Cardiac enlargement without vascular congestion. No edema. Emphysematous changes in the upper lungs. Mediastinal contours appear intact. IMPRESSION: Shallow inspiration with atelectasis or infiltration in the lung bases and probable small bilateral pleural effusions. Findings are similar to previous study. Bilateral pneumonia could have this appearance. Electronically Signed   By: Burman Nieves M.D.   On: 12/29/2017 05:21   Dg Chest Port 1 View  Result Date: 12/28/2017 CLINICAL DATA:  Shortness of Breath EXAM: PORTABLE CHEST 1 VIEW COMPARISON:  December 19, 2017 FINDINGS: There is evident bullous disease in the upper lobes, more notable on the right than on the left. There is airspace consolidation in both lung bases with small pleural effusions bilaterally. Lungs elsewhere are clear. Heart is mildly enlarged with pulmonary vascularity reflecting underlying  bullous disease in the upper lobes. No adenopathy. No bone lesions. 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 Airspace consolidation both lower lobes with small pleural effusions. Suspect bilateral pneumonia, although pulmonary edema could present as well in this manner. Both entities may exist concurrently.  Stable cardiac silhouette.  Pulmonary vascularity remains stable. Electronically Signed   By: Bretta Bang III M.D.   On: 12/28/2017 09:40     Medical Consultants:    None.  Anti-Infectives:   Augmentin  Subjective:    [name] he relates he feels better  Objective:    Vitals:   12/29/17 1937 12/29/17 2053 12/30/17 0600 12/30/17 0623  BP:   134/85   Pulse:   73   Resp:   20   Temp: 97.8 F (36.6 C)  (!) 97.5 F (36.4 C)   TempSrc: Oral  Oral   SpO2:  95% 90%   Weight:    99.4 kg (219 lb 2.2 oz)    Intake/Output Summary (Last 24 hours) at 12/30/2017 0846 Last data filed at 12/30/2017 0603 Gross per 24 hour  Intake 1953.75 ml  Output 700 ml  Net 1253.75 ml   Filed Weights   12/28/17 0950 12/29/17 0500 12/30/17 0623  Weight: 99.3 kg (218 lb 14.7 oz) 101 kg (222 lb 10.6 oz) 99.4 kg (219 lb 2.2 oz)    Exam: General exam: In no acute distress. Respiratory system: Good air movement and clear to auscultation. Cardiovascular system: S1 & S2 heard, RRR. No JVD, murmurs, rubs, gallops or clicks.  Gastrointestinal system: Abdomen is nondistended, soft and nontender.  Central nervous system: Alert and oriented. No focal neurological deficits. Extremities: No pedal edema. Skin: No rashes, lesions or ulcers Psychiatry: Judgement and insight appear normal. Mood & affect appropriate.    Data Reviewed:    Labs: Basic Metabolic Panel: Recent Labs  Lab 12/28/17 0651 12/29/17 0330 12/30/17 0411  NA 136 137 138  K 3.1* 3.4* 3.5  CL 103 107 108  CO2 23 24 26   GLUCOSE 113* 100* 97  BUN 21* 14 18  CREATININE 0.99 0.97 0.98  CALCIUM 8.6* 8.2* 8.6*  MG 2.1 1.9  --    GFR Estimated Creatinine Clearance: 113.1 mL/min (by C-G formula based on SCr of 0.98 mg/dL). Liver Function Tests: Recent Labs  Lab 12/28/17 0651  AST 100*  ALT 65*  ALKPHOS 116  BILITOT 0.6  PROT 6.2*  ALBUMIN 3.4*   No results for input(s): LIPASE, AMYLASE in the last 168  hours. No results for input(s): AMMONIA in the last 168 hours. Coagulation profile Recent Labs  Lab 12/28/17 0651  INR 0.94    CBC: Recent Labs  Lab 12/28/17 0651 12/28/17 1053 12/29/17 0330 12/30/17 0411  WBC 3.0*  --  6.4 7.7  NEUTROABS 2.5  --   --   --   HGB 9.1*  --  7.3* 7.0*  HCT 26.7* 21.0* 21.6* 20.6*  MCV 102.3*  --  103.8* 103.5*  PLT 100*  --  150 139*   Cardiac Enzymes: Recent Labs  Lab 12/28/17 0651  CKTOTAL 985*  TROPONINI <0.03   BNP (last 3 results) No results for input(s): PROBNP in the last 8760 hours. CBG: Recent Labs  Lab 12/28/17 0649  GLUCAP 106*   D-Dimer: No results for input(s): DDIMER in the last 72 hours. Hgb A1c: No results for input(s): HGBA1C in the last 72 hours. Lipid Profile: No results for input(s): CHOL, HDL,  LDLCALC, TRIG, CHOLHDL, LDLDIRECT in the last 72 hours. Thyroid function studies: Recent Labs    12/28/17 0652  TSH 15.442*  T3FREE 1.1*   Anemia work up: Recent Labs    12/28/17 1053  VITAMINB12 2,220*   Sepsis Labs: Recent Labs  Lab 12/28/17 0651 12/29/17 0330 12/30/17 0411  WBC 3.0* 6.4 7.7   Microbiology Recent Results (from the past 240 hour(s))  MRSA PCR Screening     Status: None   Collection Time: 12/28/17 10:05 AM  Result Value Ref Range Status   MRSA by PCR NEGATIVE NEGATIVE Final    Comment:        The GeneXpert MRSA Assay (FDA approved for NASAL specimens only), is one component of a comprehensive MRSA colonization surveillance program. It is not intended to diagnose MRSA infection nor to guide or monitor treatment for MRSA infections.      Medications:   . heparin  5,000 Units Subcutaneous Q8H  . ipratropium-albuterol  3 mL Nebulization TID  . levothyroxine  100 mcg Oral QAC breakfast   Continuous Infusions: . sodium chloride    . ampicillin-sulbactam (UNASYN) IV 3 g (12/30/17 0745)  . dextrose 5 % and 0.45 % NaCl with KCl 40 mEq/L 50 mL/hr at 12/29/17 1404  .  famotidine (PEPCID) IV Stopped (12/29/17 2155)      LOS: 2 days   Marinda ElkAbraham Feliz Ortiz  [facility] Pager [phone]  *Please refer to [email or web address], password [email or web address] to get updated schedule on who will round on this patient, as hospitalists switch teams weekly. If 7PM-7AM, please contact night-coverage at [email or web address], password [email or web address] for any overnight needs.  12/30/2017, 8:46 AM

## 2017-12-30 NOTE — Progress Notes (Signed)
PHARMACIST - PHYSICIAN COMMUNICATION  DR:   David StallFeliz Simpson  CONCERNING: IV to Oral Route Change Policy  RECOMMENDATION: This patient is receiving famotidine by the intravenous route.  Based on criteria approved by the Pharmacy and Therapeutics Committee, the intravenous medication(s) is/are being converted to the equivalent oral dose form(s).   DESCRIPTION: These criteria include:  The patient is eating (either orally or via tube) and/or has been taking other orally administered medications for a least 24 hours  The patient has no evidence of active gastrointestinal bleeding or impaired GI absorption (gastrectomy, short bowel, patient on TNA or NPO).  If you have questions about this conversion, please contact the Pharmacy Department  []   [phone]( [phone])  [facility]   Lynann Demetrius PharmD, [place] Pager [phone] 12/30/2017 8:59 AM

## 2017-12-30 NOTE — Clinical Social Work Note (Signed)
Clinical Social Work Assessment  Patient Details  Name: Bruce Simpson MRN: [id number] Date of Birth: [birth year]  Date of referral:  12/30/17               Reason for consult:  Substance Use/ETOH Abuse, Mental Health Concerns, Community Resources, Discharge Planning, Housing Concerns/Homelessness                Permission sought to share information with:  Case Manager Permission granted to share information::  No  Name::        Agency::     Relationship::     Denies any living family at this time  Contact Information:     Housing/Transportation Living arrangements for the past 2 months:  Homeless Source of Information:  Patient, Medical Team, Case Manager Patient Interpreter Needed:  None Criminal Activity/Legal Involvement Pertinent to Current Situation/Hospitalization:  No - Comment as needed Significant Relationships:  Friend Lives with:  Self Do you feel safe going back to the place where you live?  Yes Need for family participation in patient care:  No (Coment)  Care giving concerns:  Patient admitted from the streets where he was hypothermic and presenting with pneumonia.  Patient reports he is independent with all ADLs and needs.  He reports he [place]tent [address] behind [employer].  At this time patient reports " I just give up", "It is cold outside and I think I need help."  He is agreeable for possible placement in [facility] or shelter at discharge to help prevent relapse into hospital and stabilize his medical issues.   Social Worker assessment / plan:  LCSW met with patient. Discussed previous admission at [facility] and what patient has been doing in last week after hospitalization.    Patient reports he returned to his tent in the woods.  He reports has been living in [place] for last 2 years since moving from [place] to be with more supports in [place] and friends.  Patient reports in [place] he grew up with his mother and father and  completed his GED.  Reports he did get into some trouble and was sent to juvenile detention center as a teen.  Reports this was his first run in with law and since he has spent 13 years in prison for 2nd degree murder.  Patient reports he is clear and free of any legal charges and probation at this time. He reports he owes nothing to no one.  Patient reports he has never been married nor has children.   Patient reports since moving from [place] 2 years ago he has been homeless. He thinks he has been homeless around 5 years due to inability to hold a steady job in [place]. He reports all of his supports and family are deceased, so he spends most of his time alone in the woods.  He reports he is active with [facility] for washing his clothes, some food and getting his mail. Patient reports he does not like [facility] and has not been staying in a shelter, however he could and would like too after this admission. He reports he typically walks or takes the bus.  He receives food from different places around town, but sometimes he does not eat.  He reports history of ETOH, but reports he has not drank in three weeks. He does smoke weed daily 2-3 puffs of a joint by report.  He feels substance abuse is not an issue  for himself.    He voices he does have a history of depression and was taking Zoloft.  He reports this was very helpful for him in the past and would like to restart and LCSW will refer patient to [facility] and obtain an appointment for him at discharge.   Patient also agreeable for VI-SPDAT to be completed and he will follow up at [facility] for medical care and case management.  LCSW completed assessment for [facility] and scanned to computer and sent to [facility] for follow up with patient.  Call placed to [facility] to make them aware.  They report contracts are in place currently and able to assist patient if he meets critieria.     Plan: refer to partners and completed  VI-SPDAT Call placed to Bevely Palmer for appointment at [facility] for medical PCP Call placed to [facility] to check status of beds and also gave white flag options due to weather. Patient will need bus pass at dc as well as note to shelter stating medical need for him to remain inside shelter.   Will follow up with patient prior to DC.  Employment status:  Unemployed Forensic scientist:  Self Pay (Medicaid Pending) PT Recommendations:  Not assessed at this time Information / Referral to community resources:  Outpatient Psychiatric Care (Comment Required), Other (Comment Required)([facility], [facility] )  Patient/Family's Response to care:  Patient engaged in assessment and appreciative of assistance.  Patient/Family's Understanding of and Emotional Response to Diagnosis, Current Treatment, and Prognosis:  Patient lacking urgency in care at this time. He sips his coffee and engages however he does not seem worried or concerned about his medical dx or needs at discharge.     Emotional Assessment Appearance:  Appears stated age Attitude/Demeanor/Rapport:  Engaged, Gracious Affect (typically observed):  Accepting, Pleasant Orientation:  Oriented to Self, Oriented to Place, Oriented to  Time, Oriented to Situation Alcohol / Substance use:  Alcohol Use, Other(THC, reports no more ETOH for last three weeks) Psych involvement (Current and /or in the community):  No (Comment)(Previously took Zoloft and wants to resume)  Discharge Needs  Concerns to be addressed:  Mental Health Concerns, Substance Abuse Concerns, Home Safety Concerns, Homelessness, Discharge Planning Concerns, Adjustment to Illness, Care Coordination Readmission within the last 30 days:  Yes Current discharge risk:  Lack of support system, Homeless Barriers to Discharge:  Homeless with medical needs, Continued Medical Work up   Lilly [place], LCSW 12/30/2017, 9:52 AM

## 2017-12-31 LAB — CBC
HEMATOCRIT: 22.7 % — AB (ref 39.0–52.0)
Hemoglobin: 7.6 g/dL — ABNORMAL LOW (ref 13.0–17.0)
MCH: 32.8 pg (ref 26.0–34.0)
MCHC: 33.5 g/dL (ref 30.0–36.0)
MCV: 97.8 fL (ref 78.0–100.0)
Platelets: 144 10*3/uL — ABNORMAL LOW (ref 150–400)
RBC: 2.32 MIL/uL — AB (ref 4.22–5.81)
RDW: 23.8 % — ABNORMAL HIGH (ref 11.5–15.5)
WBC: 6.8 10*3/uL (ref 4.0–10.5)

## 2017-12-31 LAB — TYPE AND SCREEN
ABO/RH(D): O NEG
Antibody Screen: NEGATIVE
UNIT DIVISION: 0

## 2017-12-31 LAB — BPAM RBC
Blood Product Expiration Date: 201902032359
ISSUE DATE / TIME: 201901221144
Unit Type and Rh: 9500

## 2017-12-31 MED ORDER — FERROUS SULFATE 325 (65 FE) MG PO TABS
325.0000 mg | ORAL_TABLET | Freq: Every day | ORAL | Status: DC
  Administered 2017-12-31 – 2018-01-05 (×6): 325 mg via ORAL
  Filled 2017-12-31 (×6): qty 1

## 2017-12-31 MED ORDER — FOLIC ACID 1 MG PO TABS
1.0000 mg | ORAL_TABLET | Freq: Every day | ORAL | Status: DC
  Administered 2017-12-31 – 2018-01-05 (×6): 1 mg via ORAL
  Filled 2017-12-31 (×6): qty 1

## 2017-12-31 MED ORDER — METHYLPREDNISOLONE SODIUM SUCC 40 MG IJ SOLR
40.0000 mg | Freq: Every day | INTRAMUSCULAR | Status: DC
  Administered 2017-12-31 – 2018-01-01 (×2): 40 mg via INTRAVENOUS
  Filled 2017-12-31 (×2): qty 1

## 2017-12-31 NOTE — Progress Notes (Signed)
Patient placed back on [place] post tx per O2 sat of 87-88% on RA.

## 2017-12-31 NOTE — Progress Notes (Addendum)
PROGRESS NOTE    Derryl Uher  ZOX:[id number] DOB: 09/16/1966 DOA: 12/28/2017 PCP: Patient, No Pcp Per   Brief Narrative: Patient is a 52 year old male who is homeless who was brought to the emergency department for evaluation of hypothermia and altered mental status.  He was also combative on presentation.  Patient also  found to be masturbating outside homeless shelter. In the emergency department was found to be hypotensive and hypothermic.  He has been admitted in the past with similar complaint.  During his last admission his TSH was found to be 17 and he was treated with IV steroids and Synthyroid and then discharged on 12/22/17.  Assessment & Plan:   Principal Problem:   Acute hypoxemic respiratory failure (HCC) Active Problems:   Hypothermia   Acute metabolic encephalopathy   Macrocytic anemia   Hypokalemia   Hypothyroidism   Marijuana abuse   Aspiration pneumonia (HCC)  Acute hypoxic respiratory failure: Likely secondary to aspiration pneumonia.  Chest x-ray showed bilateral lower lobe infiltration. Currently he needs supplemental oxygen for saturation maintenance.  We will attempt to wean him off oxygen. Continue antibiotics.  Currently on Augmentin.  Hypothermia/bradycardia/hypotension:  Was treated conservatively with IV fluids and Bair hugger.  Temperature has improved.  Hypothermia most likely secondary to cold exposure.  He was living in the woods.  Iron deficiency anemia/low folic acid level: Status post transfusion with 1 unit of PRBC.  Started on iron supplement and folic acid.  Acute metabolic encephalopathy: UDS positive for marijuana.  Mental status currently stable.  Hypothyroidism: Continue Synthyroid.  COPD: Imagings also suggestive of this .  He is a chronic smoker.  Auscultation revealed bilateral wheezes.  Continue bronchodilators.  Started on IV steroids today. DC planning to assisted living facility.  Not sure how the discharge planning will go if  he needs home oxygen.       DVT prophylaxis: Heparin [place] Code Status: Full Family Communication: None presented at the bedside Disposition Plan: Assisted living   Consultants: None  Procedures: None  Antimicrobials: Augmentin  Subjective: Patient seen and examined the bedside this morning.  Was found to be wheezing.  Currently on supplemental oxygen.  Respiratory status is improving.  Denies any new complaints.   Objective: Vitals:   12/31/17 0529 12/31/17 0907 12/31/17 1312 12/31/17 1423  BP: 122/90  (!) 123/91   Pulse: 75  75 78  Resp: 18  16 18   Temp: (!) 97.5 F (36.4 C)  97.8 F (36.6 C)   TempSrc: Oral  Oral   SpO2: 91% 91% 96% (!) 88%  Weight: 99.1 kg (218 lb 7.6 oz)       Intake/Output Summary (Last 24 hours) at 12/31/2017 1524 Last data filed at 12/31/2017 1314 Gross per 24 hour  Intake 840 ml  Output 1 ml  Net 839 ml   Filed Weights   12/29/17 0500 12/30/17 0623 12/31/17 0529  Weight: 101 kg (222 lb 10.6 oz) 99.4 kg (219 lb 2.2 oz) 99.1 kg (218 lb 7.6 oz)    Examination:  General exam: Appears calm and comfortable ,in mild respiratory distress,average built Respiratory system: Bilateral decreased air entry, wheezes  cardiovascular system: S1 & S2 heard, RRR. No JVD, murmurs, rubs, gallops or clicks. No pedal edema. Gastrointestinal system: Abdomen is nondistended, soft and nontender. No organomegaly or masses felt. Normal bowel sounds heard. Central nervous system: Alert and oriented. No focal neurological deficits. Extremities: No edema, no clubbing ,no cyanosis, distal peripheral pulses palpable. Skin: No rashes, lesions  or ulcers,no icterus ,no pallor Psychiatry: Judgement and insight appear normal. Mood & affect appropriate.     Data Reviewed: I have personally reviewed following labs and imaging studies  CBC: Recent Labs  Lab 12/28/17 0651 12/28/17 1053 12/29/17 0330 12/30/17 0411 12/31/17 0402  WBC 3.0*  --  6.4 7.7 6.8  NEUTROABS  2.5  --   --   --   --   HGB 9.1*  --  7.3* 7.0* 7.6*  HCT 26.7* 21.0* 21.6* 20.6* 22.7*  MCV 102.3*  --  103.8* 103.5* 97.8  PLT 100*  --  150 139* 144*   Basic Metabolic Panel: Recent Labs  Lab 12/28/17 0651 12/29/17 0330 12/30/17 0411  NA 136 137 138  K 3.1* 3.4* 3.5  CL 103 107 108  CO2 23 24 26   GLUCOSE 113* 100* 97  BUN 21* 14 18  CREATININE 0.99 0.97 0.98  CALCIUM 8.6* 8.2* 8.6*  MG 2.1 1.9  --    GFR: Estimated Creatinine Clearance: 113 mL/min (by C-G formula based on SCr of 0.98 mg/dL). Liver Function Tests: Recent Labs  Lab 12/28/17 0651  AST 100*  ALT 65*  ALKPHOS 116  BILITOT 0.6  PROT 6.2*  ALBUMIN 3.4*   No results for input(s): LIPASE, AMYLASE in the last 168 hours. No results for input(s): AMMONIA in the last 168 hours. Coagulation Profile: Recent Labs  Lab 12/28/17 0651  INR 0.94   Cardiac Enzymes: Recent Labs  Lab 12/28/17 0651  CKTOTAL 985*  TROPONINI <0.03   BNP (last 3 results) No results for input(s): PROBNP in the last 8760 hours. HbA1C: No results for input(s): HGBA1C in the last 72 hours. CBG: Recent Labs  Lab 12/28/17 0649  GLUCAP 106*   Lipid Profile: No results for input(s): CHOL, HDL, LDLCALC, TRIG, CHOLHDL, LDLDIRECT in the last 72 hours. Thyroid Function Tests: No results for input(s): TSH, T4TOTAL, FREET4, T3FREE, THYROIDAB in the last 72 hours. Anemia Panel: Recent Labs    12/30/17 0948  VITAMINB12 2,791*  FOLATE 3.1*  FERRITIN 146  TIBC 246*  IRON 26*  RETICCTPCT 3.5*   Sepsis Labs: No results for input(s): PROCALCITON, LATICACIDVEN in the last 168 hours.  Recent Results (from the past 240 hour(s))  MRSA PCR Screening     Status: None   Collection Time: 12/28/17 10:05 AM  Result Value Ref Range Status   MRSA by PCR NEGATIVE NEGATIVE Final    Comment:        The GeneXpert MRSA Assay (FDA approved for NASAL specimens only), is one component of a comprehensive MRSA colonization surveillance program.  It is not intended to diagnose MRSA infection nor to guide or monitor treatment for MRSA infections.          Radiology Studies: No results found.      Scheduled Meds: . amoxicillin-clavulanate  1 tablet Oral Q12H  . famotidine  20 mg Oral BID  . ferrous sulfate  325 mg Oral Q breakfast  . folic acid  1 mg Oral Daily  . heparin  5,000 Units Subcutaneous Q8H  . ipratropium-albuterol  3 mL Nebulization TID  . levothyroxine  100 mcg Oral QAC breakfast  . methylPREDNISolone (SOLU-MEDROL) injection  40 mg Intravenous Daily  . nicotine  21 mg Transdermal Daily   Continuous Infusions:   LOS: 3 days    Time spent: 25  minutes    Ulis Kaps Salli QuarryAdhikari BK, MD [facility] Pager [phone]  If 7PM-7AM, please contact night-coverage [email or web address] Password [email or web address] 12/31/2017,  3:24 PM

## 2018-01-01 ENCOUNTER — Inpatient Hospital Stay (HOSPITAL_COMMUNITY): Payer: Self-pay

## 2018-01-01 MED ORDER — METHYLPREDNISOLONE SODIUM SUCC 40 MG IJ SOLR
40.0000 mg | Freq: Two times a day (BID) | INTRAMUSCULAR | Status: DC
  Administered 2018-01-01 – 2018-01-02 (×2): 40 mg via INTRAVENOUS
  Filled 2018-01-01 (×2): qty 1

## 2018-01-01 MED ORDER — VITAMIN B-1 100 MG PO TABS
100.0000 mg | ORAL_TABLET | Freq: Every day | ORAL | Status: DC
  Administered 2018-01-01 – 2018-01-05 (×5): 100 mg via ORAL
  Filled 2018-01-01 (×5): qty 1

## 2018-01-01 MED ORDER — CHLORDIAZEPOXIDE HCL 5 MG PO CAPS: 25.0000 mg | ORAL_CAPSULE | Freq: Four times a day (QID) | ORAL | Status: DC | PRN

## 2018-01-01 NOTE — Progress Notes (Signed)
PROGRESS NOTE    Bruce Simpson  ZOX:[id number] DOB: 29-Nov-1966 DOA: 12/28/2017 PCP: Patient, No Pcp Per   Brief Narrative: Patient is a 52 year old homeless male who was brought to the emergency department for evaluation of hypothermia and altered mental status.  He was also combative on presentation.  Patient also  found to be masturbating outside homeless shelter. In the emergency department was found to be hypotensive and hypothermic.  He has been admitted in the past with similar complaint.  During his last admission his TSH was found to be 17 and he was treated with IV steroids and Synthyroid and then discharged on 12/22/17.  Assessment & Plan:   Principal Problem:   Acute hypoxemic respiratory failure (HCC) Active Problems:   Hypothermia   Acute metabolic encephalopathy   Macrocytic anemia   Hypokalemia   Hypothyroidism   Marijuana abuse   Aspiration pneumonia (HCC)  Acute hypoxic respiratory failure: Likely secondary to aspiration pneumonia/COPD exacerbation.  Chest x-ray showed bilateral lower lobe infiltration. Currently he needs supplemental oxygen for saturation maintenance.  We will attempt to wean him off oxygen.We will check if he need home oxygen on discharge. Continue antibiotics.  Currently on Augmentin.  Hypothermia/bradycardia/hypotension:  Was treated conservatively with IV fluids and Bair hugger.  Temperature has improved.  Hypothermia most likely secondary to cold exposure.  [place]  Iron deficiency anemia/low folic acid level: Status post transfusion with 1 unit of PRBC.  Started on iron supplement and folic acid.  Acute metabolic encephalopathy: Complaint of visual hallucination this morning.  Stated that there he saw a pig in the room. Currently he is alert and oriented.  No visual hallucination during my evaluation.   UDS was positive for marijuana on presentation.  Mental status currently stable.  Hypothyroidism: Continue  Synthyroid.  COPD: Imagings also suggestive of this .  He is a chronic smoker.  Auscultation revealed bilateral wheezes.  Continue bronchodilators.  Started on IV steroids. DC planning to assisted living facility.  Not sure how the discharge planning will go if he needs home oxygen.  Chronic alcohol abuse /smoker: Drinks daily.  Last drink was about 3 weeks ago as per the patient.  Smokes a pack for last 15 years.  On nicotine patch. Does not have any tremors, alert and oriented at present.  Vital signs stable.  Likely not withdrawing at the moment.  Will start on Librium as needed. Continue thiamine and folic acid.       DVT prophylaxis: Heparin O[place] Code Status: Full Family Communication: None presented at the bedside Disposition Plan: Assisted living tomorrow   Consultants: None  Procedures: None  Antimicrobials: Augmentin  Subjective: Patient seen and examined the bedside this morning.  Appears comfortable.  Currently alert and oriented during my evaluation.  Still has bilateral expiratory wheezes.  Objective: Vitals:   12/31/17 2045 12/31/17 2046 01/01/18 0401 01/01/18 0738  BP:  (!) 123/93 (!) 143/97   Pulse:  80 65   Resp:  16 12   Temp:  98.7 F (37.1 C) (!) 97.4 F (36.3 C)   TempSrc:  Oral Oral   SpO2: 94% 92% 91% 92%  Weight:   100.4 kg (221 lb 5.5 oz)     Intake/Output Summary (Last 24 hours) at 01/01/2018 1251 Last data filed at 12/31/2017 2046 Gross per 24 hour  Intake 840 ml  Output -  Net 840 ml   Filed Weights   12/30/17 0623 12/31/17 0529 01/01/18 0401  Weight:  99.4 kg (219 lb 2.2 oz) 99.1 kg (218 lb 7.6 oz) 100.4 kg (221 lb 5.5 oz)    Examination:  General exam: Appears calm and comfortable ,Not in distress,average built Respiratory system: Bilateral decreased air entry, bilateral expiratory wheezes  cardiovascular system: S1 & S2 heard, RRR. No JVD, murmurs, rubs, gallops or clicks. No pedal edema. Gastrointestinal system: Abdomen is  nondistended, soft and nontender. No organomegaly or masses felt. Normal bowel sounds heard. Central nervous system: Alert and oriented. No focal neurological deficits. Extremities: No edema, no clubbing ,no cyanosis, distal peripheral pulses palpable. Skin: No cyanosis,No pallor,No Rash,No Ulcer Psychiatry: Judgement and insight appear normal. Mood & affect appropriate.      Data Reviewed: I have personally reviewed following labs and imaging studies  CBC: Recent Labs  Lab 12/28/17 0651 12/28/17 1053 12/29/17 0330 12/30/17 0411 12/31/17 0402  WBC 3.0*  --  6.4 7.7 6.8  NEUTROABS 2.5  --   --   --   --   HGB 9.1*  --  7.3* 7.0* 7.6*  HCT 26.7* 21.0* 21.6* 20.6* 22.7*  MCV 102.3*  --  103.8* 103.5* 97.8  PLT 100*  --  150 139* 144*   Basic Metabolic Panel: Recent Labs  Lab 12/28/17 0651 12/29/17 0330 12/30/17 0411  NA 136 137 138  K 3.1* 3.4* 3.5  CL 103 107 108  CO2 23 24 26   GLUCOSE 113* 100* 97  BUN 21* 14 18  CREATININE 0.99 0.97 0.98  CALCIUM 8.6* 8.2* 8.6*  MG 2.1 1.9  --    GFR: Estimated Creatinine Clearance: 113.6 mL/min (by C-G formula based on SCr of 0.98 mg/dL). Liver Function Tests: Recent Labs  Lab 12/28/17 0651  AST 100*  ALT 65*  ALKPHOS 116  BILITOT 0.6  PROT 6.2*  ALBUMIN 3.4*   No results for input(s): LIPASE, AMYLASE in the last 168 hours. No results for input(s): AMMONIA in the last 168 hours. Coagulation Profile: Recent Labs  Lab 12/28/17 0651  INR 0.94   Cardiac Enzymes: Recent Labs  Lab 12/28/17 0651  CKTOTAL 985*  TROPONINI <0.03   BNP (last 3 results) No results for input(s): PROBNP in the last 8760 hours. HbA1C: No results for input(s): HGBA1C in the last 72 hours. CBG: Recent Labs  Lab 12/28/17 0649  GLUCAP 106*   Lipid Profile: No results for input(s): CHOL, HDL, LDLCALC, TRIG, CHOLHDL, LDLDIRECT in the last 72 hours. Thyroid Function Tests: No results for input(s): TSH, T4TOTAL, FREET4, T3FREE, THYROIDAB in  the last 72 hours. Anemia Panel: Recent Labs    12/30/17 0948  VITAMINB12 2,791*  FOLATE 3.1*  FERRITIN 146  TIBC 246*  IRON 26*  RETICCTPCT 3.5*   Sepsis Labs: No results for input(s): PROCALCITON, LATICACIDVEN in the last 168 hours.  Recent Results (from the past 240 hour(s))  MRSA PCR Screening     Status: None   Collection Time: 12/28/17 10:05 AM  Result Value Ref Range Status   MRSA by PCR NEGATIVE NEGATIVE Final    Comment:        The GeneXpert MRSA Assay (FDA approved for NASAL specimens only), is one component of a comprehensive MRSA colonization surveillance program. It is not intended to diagnose MRSA infection nor to guide or monitor treatment for MRSA infections.          Radiology Studies: No results found.      Scheduled Meds: . amoxicillin-clavulanate  1 tablet Oral Q12H  . famotidine  20 mg Oral BID  .  ferrous sulfate  325 mg Oral Q breakfast  . folic acid  1 mg Oral Daily  . heparin  5,000 Units Subcutaneous Q8H  . ipratropium-albuterol  3 mL Nebulization TID  . levothyroxine  100 mcg Oral QAC breakfast  . methylPREDNISolone (SOLU-MEDROL) injection  40 mg Intravenous Daily  . nicotine  21 mg Transdermal Daily   Continuous Infusions:   LOS: 4 days    Time spent: 25  minutes    Bruce Eriksen Salli Quarry, MD [facility] Pager [phone]  If 7PM-7AM, please contact night-coverage [email or web address] Password [facility] 01/01/2018, 12:51 PM

## 2018-01-01 NOTE — Progress Notes (Signed)
This am while Bess KindsRegina RN was in Mr CaballoSmiths room. He asked her if there was a pig in his room. He stated that he clearly saw a pig and a dog this am . He is alert but stated it was 1998 and does not know the date of this month. I sent a message to Dr Dillard CannonAdhika

## 2018-01-01 NOTE — Progress Notes (Signed)
O2 SAT WHILE IN BED  Was 88 with out O2.HR 72  Walked the distance of one room ,O2 SAT 80 hr 82

## 2018-01-01 NOTE — Progress Notes (Signed)
CM consult for PCP. [facility] and plans to follow up at the clinic there at discharge. Sandford Crazeora Latika Kronick RN,BSN,NCM [phone]

## 2018-01-02 ENCOUNTER — Inpatient Hospital Stay (INDEPENDENT_AMBULATORY_CARE_PROVIDER_SITE_OTHER): Payer: Self-pay | Admitting: Physician Assistant

## 2018-01-02 MED ORDER — FERROUS SULFATE 325 (65 FE) MG PO TABS: 325.0000 mg | ORAL_TABLET | Freq: Every day | ORAL | 0 refills | Status: DC

## 2018-01-02 MED ORDER — IPRATROPIUM-ALBUTEROL 0.5-2.5 (3) MG/3ML IN SOLN
3.0000 mL | Freq: Two times a day (BID) | RESPIRATORY_TRACT | Status: DC
  Administered 2018-01-03 (×2): 3 mL via RESPIRATORY_TRACT
  Filled 2018-01-02 (×2): qty 3

## 2018-01-02 MED ORDER — FAMOTIDINE 20 MG PO TABS: 20.0000 mg | ORAL_TABLET | Freq: Two times a day (BID) | ORAL | 0 refills | Status: DC

## 2018-01-02 MED ORDER — PREDNISONE 20 MG PO TABS: 40.0000 mg | ORAL_TABLET | Freq: Every day | ORAL | Status: DC

## 2018-01-02 MED ORDER — ALBUTEROL SULFATE HFA 108 (90 BASE) MCG/ACT IN AERS: 2.0000 | INHALATION_SPRAY | Freq: Four times a day (QID) | RESPIRATORY_TRACT | 2 refills | Status: DC | PRN

## 2018-01-02 MED ORDER — FOLIC ACID 1 MG PO TABS: 1.0000 mg | ORAL_TABLET | Freq: Every day | ORAL | 0 refills | Status: DC

## 2018-01-02 MED ORDER — THIAMINE HCL 100 MG PO TABS: 100.0000 mg | ORAL_TABLET | Freq: Every day | ORAL | 0 refills | Status: DC

## 2018-01-02 MED ORDER — AMOXICILLIN-POT CLAVULANATE 875-125 MG PO TABS: 1.0000 | ORAL_TABLET | Freq: Two times a day (BID) | ORAL | 0 refills | Status: DC

## 2018-01-02 MED ORDER — PREDNISONE 20 MG PO TABS: ORAL_TABLET | ORAL | 0 refills | Status: DC

## 2018-01-02 MED ORDER — LEVOTHYROXINE SODIUM 100 MCG PO TABS: 100.0000 ug | ORAL_TABLET | Freq: Every day | ORAL | 0 refills | Status: DC

## 2018-01-02 MED ORDER — METHYLPREDNISOLONE SODIUM SUCC 40 MG IJ SOLR
40.0000 mg | Freq: Two times a day (BID) | INTRAMUSCULAR | Status: DC
  Administered 2018-01-02 – 2018-01-05 (×6): 40 mg via INTRAVENOUS
  Filled 2018-01-02 (×6): qty 1

## 2018-01-02 NOTE — Progress Notes (Signed)
SATURATION QUALIFICATIONS: (This note is used to comply with regulatory documentation for home oxygen)  Patient Saturations on Room Air at Rest =92%  Patient Saturations on Room Air while Ambulating = 87%  Patient Saturations on 3Liters of oxygen while Ambulating = 95%  Please briefly explain why patient needs home oxygen: pt saturation drops when ambulating.

## 2018-01-02 NOTE — Discharge Instructions (Signed)
[facility] [address], [place], [place] [address] [phone]  [facility] [address], [place], [place] [address] [phone]

## 2018-01-02 NOTE — Progress Notes (Signed)
CSW following as pt is homeless. Pt has options for [facility], however he states at this point he is planning to return to his tent at [place] as "he is feeling so much better and doesn't want to be around the types of people at the shelters." CSW encouraged pt to access these options during cold weather, pt states he will consider it. States he does not plan to use O2 once discharged. CSW provided bus pass for pt to use at [place]. Rem[facility] of [facility] (are listed on AVS).  Ilean SkillMeghan Iwao Shamblin, MSW, LCSW Clinical Social Work 01/02/2018 [phone]

## 2018-01-02 NOTE — Progress Notes (Addendum)
Per conversation with pt at bedside, he states that he will not use 02 if he is discharged with it. MD made aware. [facility] rep also alerted me that they would only be able to provide home 02 if pt will stay at a shelter during the night and day for at least a few days. Sandford Crazeora Dosha Broshears RN,BSN,NCM [phone]

## 2018-01-02 NOTE — Discharge Summary (Addendum)
Physician Discharge Summary  Bruce Simpson WUJ:[id number] DOB: October 17, 1966 DOA: 12/28/2017  PCP: Patient, No Pcp Per  Admit date: 12/28/2017 Discharge date: 01/02/2018  Admitted From: Home Disposition: Shelter  Equipment/Devices:Oxygen  Discharge Condition: Stable CODE STATUS:Full Diet recommendation:  Regular   Brief/Interim Summary: Patient is a 52 year old homeless male who was brought to the emergency department for evaluation of hypothermia and altered mental status.  He was also combative on presentation. Patient also  found to be masturbating outside homeless shelter. In the emergency department was found to be hypotensive and hypothermic.  He has been admitted in the past with similar complaint.  During his last admission his TSH was found to be 17 and he was treated with IV steroids and Synthyroid and then discharged on 12/22/17. Patient was also found to have possible pneumonia on this admission and was started on antibiotics.  Hypothermia resolved after he was treated conservatively with IV fluids and Bair hugger.  Since he has been smoking for last 15 years he might also have undiagnosed COPD.  He continues to have expiratory wheezes and his chest x-ray was also suggestive of emphysematous changes . Patient's overall status has improved.  Currently he is saturating fine on room air.   Patient will be discharged to shelter.  He needs to be in the shelter during the day . He will follow up with [facility] as an outpatient.  Patient also needs pulmonary follow-up as an outpatient through the primary care referral.  Following problems were addressed during his hospitalization:   Acute hypoxic respiratory failure: Likely secondary to aspiration pneumonia/COPD exacerbation.  Chest x-ray showed bilateral lower lobe infiltration. Continue antibiotics.  Currently on Augmentin,he will finish the course at home.  Hypothermia/bradycardia/hypotension:  Was treated conservatively with IV fluids  and Bair hugger.  Temperature has improved.  Hypothermia most likely secondary to cold exposure.  [place]  Iron deficiency anemia/low folic acid level: Status post transfusion with 1 unit of PRBC.  Started on iron supplement and folic acid.  Acute metabolic encephalopathy: Complaint of visual hallucination yesterday. Currently he is alert and oriented.  No visual hallucination during my evaluation.   UDS was positive for marijuana on presentation.  Mental status currently stable.  Hypothyroidism: Continue Synthyroid.  COPD: Imagings also suggestive of this .  He is a chronic smoker.  Auscultation revealed bilateral wheezes.  Continue albuterol inhaler on discharge.  He will be discharged on tapering dose of prednisone. He will benefit from outpatient referral to pulmonology.  Chronic alcohol abuse /smoker: Drinks daily.  Last drink was about 3 weeks ago as per the patient.  Smokes a pack for last 15 years.  On nicotine patch. Does not have any tremors, alert and oriented at present.  Vital signs stable. Not withdrawing at the moment. Continue thiamine and folic acid.     Discharge Diagnoses:  Principal Problem:   Acute hypoxemic respiratory failure (HCC) Active Problems:   Hypothermia   Acute metabolic encephalopathy   Macrocytic anemia   Hypokalemia   Hypothyroidism   Marijuana abuse   Aspiration pneumonia [facility])    Discharge Instructions  Discharge Instructions    Diet general   Complete by:  As directed    Discharge instructions   Complete by:  As directed    1) Take prescribed medications as instructed. 2) Follow up with appointment at [facility] on the given date. 3) Please stop smoking and drinking alcohol. 4) Follow up with pulmonology through an outpatient referral.  Increase activity slowly   Complete by:  As directed      Allergies as of 01/02/2018   No Known Allergies     Medication List    TAKE these medications   albuterol 108 (90  Base) MCG/ACT inhaler Commonly known as:  PROVENTIL HFA;VENTOLIN HFA Inhale 2 puffs into the lungs every 6 (six) hours as needed for wheezing or shortness of breath.   amoxicillin-clavulanate 875-125 MG tablet Commonly known as:  AUGMENTIN Take 1 tablet by mouth every 12 (twelve) hours.   famotidine 20 MG tablet Commonly known as:  PEPCID Take 1 tablet (20 mg total) by mouth 2 (two) times daily.   ferrous sulfate 325 (65 FE) MG tablet Take 1 tablet (325 mg total) by mouth daily with breakfast. Start taking on:  01/03/2018   folic acid 1 MG tablet Commonly known as:  FOLVITE Take 1 tablet (1 mg total) by mouth daily. Start taking on:  01/03/2018   levothyroxine 100 MCG tablet Commonly known as:  SYNTHROID, LEVOTHROID Take 1 tablet (100 mcg total) by mouth daily before breakfast.   nicotine 14 mg/24hr patch Commonly known as:  NICODERM CQ - dosed in mg/24 hours Place 1 patch (14 mg total) onto the skin daily.   predniSONE 20 MG tablet Commonly known as:  DELTASONE Take 40 mg daily for 3 days then 20 mg daily for 3 days then 10 mg daily for 3 days then stop.   thiamine 100 MG tablet Take 1 tablet (100 mg total) by mouth daily. Start taking on:  01/03/2018            Durable Medical Equipment  (From admission, onward)        Start     Ordered   01/01/18 1408  For home use only DME oxygen  Once    Question Answer Comment  Mode or (Route) Nasal cannula   Liters per Minute 3   Oxygen delivery system Gas      01/01/18 1407     Follow-up Information    [facility] Follow up on 01/06/2018.   Why:  Appointment for intake/resume services.  Please arrive at 11:00am.  Bring your photo ID. This appointment is to establish care and resume your medication for depression. Contact information: [address] [place] [address] [phone]        [facility]. Go on 01/05/2018.   Why:  Go to the [facility] and ask to be seen in the CLINIC in effort  to register for follow up in Clinic. Staff is already aware of you coming.  Please arrive by 9:00am. Contact information: [facility] [address] [place], [place] [address]  Chales Abrahams: [phone]         No Known Allergies  Consultations: None  Procedures/Studies: Dg Chest 1 View  Result Date: 01/01/2018 CLINICAL DATA:  Dyspnea EXAM: CHEST 1 VIEW COMPARISON:  12/29/2017 and priors 01/26/2018 FINDINGS: Borderline cardiomegaly. No aortic aneurysm. Upper lobe emphysematous hyperinflation of the lungs, right greater than left with crowding of lower lobe interstitial lung markings, similar in appearance to prior. Resultant hazy opacities at the lung bases right greater than left. Superimposed minimal pneumonia is not entirely excluded especially along the medial aspect of the right lung base. No effusion or pneumothorax. No acute osseous abnormality. IMPRESSION: Redemonstration of upper lobe predominant emphysema, right greater than left with resultant crowding of lower lobe interstitial lung markings. Slightly more confluent appearing bibasilar airspace  opacities right greater than left cannot exclude the possibility of subtle pneumonia. Findings are similar to recent comparisons. Electronically Signed   By: Tollie Eth M.D.   On: 01/01/2018 16:21   Ct Head Wo Contrast  Result Date: 12/16/2017 CLINICAL DATA:  Unwitnessed fall with altered level of consciousness EXAM: CT HEAD WITHOUT CONTRAST TECHNIQUE: Contiguous axial images were obtained from the base of the skull through the vertex without intravenous contrast. COMPARISON:  None. FINDINGS: Brain: There is mild atrophy for age. There is prominence of the cisterna magna, a presumed anatomic variant. There is no appreciable intracranial mass, hemorrhage, extra-axial fluid collection, or midline shift. There is minimal small vessel disease in the centra semiovale bilaterally. Elsewhere gray-white compartments appear normal.  No evident acute infarct. Vascular: No hyperdense vessel. There is mild calcification in the right carotid siphon. Skull: Bony calvarium appears intact. Sinuses/Orbits: There is slight mucosal thickening in several ethmoid air cells. There is a rather minimal retention cyst in the medial left maxillary antrum. Orbits appear symmetric bilaterally. Other: Mastoid air cells appear clear. IMPRESSION: Mild atrophy for age. Minimal periventricular small vessel disease. No mass, hemorrhage, or acute infarct. Mild arterial vascular calcification.  Mild paranasal sinus disease. Electronically Signed   By: Bretta Bang III M.D.   On: 12/16/2017 09:20   Dg Chest Port 1 View  Result Date: 12/29/2017 CLINICAL DATA:  Hypoxia. EXAM: PORTABLE CHEST 1 VIEW COMPARISON:  12/28/2017 FINDINGS: Shallow inspiration. Atelectasis or infiltration in the lung bases. Small bilateral pleural effusions. Cardiac enlargement without vascular congestion. No edema. Emphysematous changes in the upper lungs. Mediastinal contours appear intact. IMPRESSION: Shallow inspiration with atelectasis or infiltration in the lung bases and probable small bilateral pleural effusions. Findings are similar to previous study. Bilateral pneumonia could have this appearance. Electronically Signed   By: Burman Nieves M.D.   On: 12/29/2017 05:21   Dg Chest Port 1 View  Result Date: 12/28/2017 CLINICAL DATA:  Shortness of Breath EXAM: PORTABLE CHEST 1 VIEW COMPARISON:  December 19, 2017 FINDINGS: There is evident bullous disease in the upper lobes, more notable on the right than on the left. There is airspace consolidation in both lung bases with small pleural effusions bilaterally. Lungs elsewhere are clear. Heart is mildly enlarged with pulmonary vascularity reflecting underlying bullous disease in the upper lobes. No adenopathy. No bone lesions. 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 Airspace consolidation both  lower lobes with small pleural effusions. Suspect bilateral pneumonia, although pulmonary edema could present as well in this manner. Both entities may exist concurrently. Stable cardiac silhouette.  Pulmonary vascularity remains stable. Electronically Signed   By: Bretta Bang III M.D.   On: 12/28/2017 09:40   Dg Chest Port 1 View  Result Date: 12/19/2017 CLINICAL DATA:  Shortness of breath. EXAM: PORTABLE CHEST 1 VIEW COMPARISON:  12/17/2017. FINDINGS: Mediastinum and hilar structures normal. Cardiomegaly with normal pulmonary vascularity. Low lung volumes with mild basilar atelectasis and/or pleuroparenchymal scarring. Similar findings noted on prior exam. No pneumothorax. IMPRESSION: 1.  Cardiomegaly.  No pulmonary venous congestion. 2. COPD. Low lung volumes with bibasilar atelectasis and/or pleuroparenchymal scarring. Electronically Signed   By: Maisie Fus  Register   On: 12/19/2017 11:06   Dg Chest Port 1 View  Result Date: 12/17/2017 CLINICAL DATA:  Altered mental status. EXAM: PORTABLE CHEST 1 VIEW COMPARISON:  12/16/2017. FINDINGS: Stable cardiomediastinal silhouette. No consolidation or edema. Hyperinflation consistent with COPD, particularly bullous change at the RIGHT apex. No pneumothorax.  Bones unremarkable. IMPRESSION: Stable chest. Electronically Signed   By: Elsie StainJohn T Curnes M.D.   On: 12/17/2017 07:20   Dg Chest Portable 1 View  Result Date: 12/16/2017 CLINICAL DATA:  Altered mental status and weakness EXAM: PORTABLE CHEST 1 VIEW COMPARISON:  None. FINDINGS: Cardiac shadow is within normal limits. Lungs are well aerated bilaterally. Emphysematous changes are noted concentrated in the right upper lobe. No focal infiltrate or sizable effusion is seen. No bony abnormality is noted. IMPRESSION: COPD without acute abnormality. Electronically Signed   By: Alcide CleverMark  Lukens M.D.   On: 12/16/2017 10:28   Dg Abd Portable 2v  Result Date: 12/22/2017 CLINICAL DATA:  The patient reports she has not had  a bowel movement for 5 days. EXAM: PORTABLE ABDOMEN - 2 VIEW COMPARISON:  None. FINDINGS: No free intraperitoneal air is identified. The bowel gas pattern is nonobstructive. A large volume of stool is seen in the colon, most prominent in the ascending and transverse. No abnormal abdominal calcification or focal bony abnormality. IMPRESSION: No acute finding. Large colonic stool burden most notable in the ascending and transverse. Electronically Signed   By: Drusilla Kannerhomas  Dalessio M.D.   On: 12/22/2017 13:56      Subjective: Patient seen and examined at the bedside this morning.  Respiratory status currently stable.  Currently alert and oriented x4.  Stable to be discharged    Discharge Exam: Vitals:   01/02/18 0401 01/02/18 0914  BP: (!) 150/77   Pulse: 63   Resp: 12   Temp: 98.7 F (37.1 C)   SpO2: 95% 98%   Vitals:   01/01/18 1952 01/01/18 2050 01/02/18 0401 01/02/18 0914  BP:  (!) 130/92 (!) 150/77   Pulse:  69 63   Resp:  14 12   Temp:  98.6 F (37 C) 98.7 F (37.1 C)   TempSrc:  Oral Oral   SpO2: 94% 95% 95% 98%  Weight:   101 kg (222 lb 10.6 oz)     General: Pt is alert, awake, not in acute distress Cardiovascular: RRR, S1/S2 +, no rubs, no gallops Respiratory: Mildly decreased air entry bilaterally Abdominal: Soft, NT, ND, bowel sounds + Extremities: no edema, no cyanosis    The results of significant diagnostics from this hospitalization (including imaging, microbiology, ancillary and laboratory) are listed below for reference.     Microbiology: Recent Results (from the past 240 hour(s))  MRSA PCR Screening     Status: None   Collection Time: 12/28/17 10:05 AM  Result Value Ref Range Status   MRSA by PCR NEGATIVE NEGATIVE Final    Comment:        The GeneXpert MRSA Assay (FDA approved for NASAL specimens only), is one component of a comprehensive MRSA colonization surveillance program. It is not intended to diagnose MRSA infection nor to guide or monitor  treatment for MRSA infections.      Labs: BNP (last 3 results) Recent Labs    12/20/17 0342  BNP 316.0*   Basic Metabolic Panel: Recent Labs  Lab 12/28/17 0651 12/29/17 0330 12/30/17 0411  NA 136 137 138  K 3.1* 3.4* 3.5  CL 103 107 108  CO2 23 24 26   GLUCOSE 113* 100* 97  BUN 21* 14 18  CREATININE 0.99 0.97 0.98  CALCIUM 8.6* 8.2* 8.6*  MG 2.1 1.9  --    Liver Function Tests: Recent Labs  Lab 12/28/17 0651  AST 100*  ALT 65*  ALKPHOS 116  BILITOT 0.6  PROT 6.2*  ALBUMIN  3.4*   No results for input(s): LIPASE, AMYLASE in the last 168 hours. No results for input(s): AMMONIA in the last 168 hours. CBC: Recent Labs  Lab 12/28/17 0651 12/28/17 1053 12/29/17 0330 12/30/17 0411 12/31/17 0402  WBC 3.0*  --  6.4 7.7 6.8  NEUTROABS 2.5  --   --   --   --   HGB 9.1*  --  7.3* 7.0* 7.6*  HCT 26.7* 21.0* 21.6* 20.6* 22.7*  MCV 102.3*  --  103.8* 103.5* 97.8  PLT 100*  --  150 139* 144*   Cardiac Enzymes: Recent Labs  Lab 12/28/17 0651  CKTOTAL 985*  TROPONINI <0.03   BNP: Invalid input(s): POCBNP CBG: Recent Labs  Lab 12/28/17 0649  GLUCAP 106*   D-Dimer No results for input(s): DDIMER in the last 72 hours. Hgb A1c No results for input(s): HGBA1C in the last 72 hours. Lipid Profile No results for input(s): CHOL, HDL, LDLCALC, TRIG, CHOLHDL, LDLDIRECT in the last 72 hours. Thyroid function studies No results for input(s): TSH, T4TOTAL, T3FREE, THYROIDAB in the last 72 hours.  Invalid input(s): FREET3 Anemia work up No results for input(s): VITAMINB12, FOLATE, FERRITIN, TIBC, IRON, RETICCTPCT in the last 72 hours. Urinalysis    Component Value Date/Time   COLORURINE YELLOW 12/28/2017 0651   APPEARANCEUR CLEAR 12/28/2017 0651   LABSPEC 1.019 12/28/2017 0651   PHURINE 5.0 12/28/2017 0651   GLUCOSEU NEGATIVE 12/28/2017 0651   HGBUR NEGATIVE 12/28/2017 0651   BILIRUBINUR NEGATIVE 12/28/2017 0651   KETONESUR 5 (A) 12/28/2017 0651   PROTEINUR  30 (A) 12/28/2017 0651   NITRITE NEGATIVE 12/28/2017 0651   LEUKOCYTESUR NEGATIVE 12/28/2017 0651   Sepsis Labs Invalid input(s): PROCALCITONIN,  WBC,  LACTICIDVEN Microbiology Recent Results (from the past 240 hour(s))  MRSA PCR Screening     Status: None   Collection Time: 12/28/17 10:05 AM  Result Value Ref Range Status   MRSA by PCR NEGATIVE NEGATIVE Final    Comment:        The GeneXpert MRSA Assay (FDA approved for NASAL specimens only), is one component of a comprehensive MRSA colonization surveillance program. It is not intended to diagnose MRSA infection nor to guide or monitor treatment for MRSA infections.      Time coordinating discharge: Over 30 minutes  SIGNED:   Meredith Leeds, MD  [facility] 01/02/2018, 11:33 AM Pager [phone]  If 7PM-7AM, please contact night-coverage [email or web address] Password [email or web address]

## 2018-01-03 MED ORDER — IPRATROPIUM-ALBUTEROL 0.5-2.5 (3) MG/3ML IN SOLN
3.0000 mL | Freq: Four times a day (QID) | RESPIRATORY_TRACT | Status: DC
  Administered 2018-01-04: 3 mL via RESPIRATORY_TRACT
  Filled 2018-01-03: qty 3

## 2018-01-03 NOTE — Progress Notes (Signed)
PROGRESS NOTE    Bruce Simpson  ZOX:[id number] DOB: November 21, 1966 DOA: 12/28/2017 PCP: Patient, No Pcp Per   Brief Narrative: Patient is a [age] year oldhomelessmale who was brought to the emergency department for evaluation of hypothermia and altered mental status. He was also combative on presentation. Patient also found to be masturbating outside homeless shelter. In the emergency department was found to be hypotensive and hypothermic. He has been admitted in the past with similar complaint. During his last admission his TSH was found to be 17 and he was treated with IV steroids and Synthyroid and then discharged on 12/22/17. Patient was also found to have possible pneumonia on this admission and was started on antibiotics.  Hypothermia resolved after he was treated conservatively with IV fluids and Bair hugger.  Since he has been smoking for last 15 years he might also have undiagnosed COPD.  He continues to have expiratory wheezes and his chest x-ray was also suggestive of emphysematous changes . Patient's overall status has improved.  He qualified for home oxygen.   Patient was planned for  discharge to shelter but patient said that he does not want to use oxygen anymore as he feels fine.Patient easily desaturates to 80s during ambulation. Since he is almost certain not to use home oxygen we decided to cancel the discharge and monitor him few days more if he does not need oxygen on discharge.   Assessment & Plan:   Principal Problem:   Acute hypoxemic respiratory failure (HCC) Active Problems:   Hypothermia   Acute metabolic encephalopathy   Macrocytic anemia   Hypokalemia   Hypothyroidism   Marijuana abuse   Aspiration pneumonia (HCC)    Acute hypoxic respiratory failure: Likely secondary to aspiration pneumonia/COPD exacerbation. Chest x-ray showed bilateral lower lobe infiltration. Currently he needs supplemental oxygen for saturation maintenance. He needs home oxygen on  discharge. Continue antibiotics. Currently on Augmentin.  Hypothermia/bradycardia/hypotension: Was treated conservatively with IV fluids and Bair hugger. Temperature has improved. Hypothermia most likely secondary to cold exposure. [place].  Iron deficiency anemia/low folic acid level: Status post transfusion with 1 unit of PRBC. Started on iron supplement and folic acid.  Acute metabolic encephalopathy:Complaint of visual hallucination yesterday. Currently he is alert and oriented. No visual hallucination during my evaluation.  UDSwaspositive for marijuana on presentation. Mental status currently stable.  Hypothyroidism: Continue Synthyroid.  COPD: Imagings also suggestive of this . He is a chronic smoker. Auscultation revealed bilateral wheezes. Continue albuterol inhaler on discharge.  He will be discharged on tapering dose of prednisone.  Chronic alcohol abuse/smoker: Drinks daily. Last drink was about 3 weeks ago as per the patient. Smokes a pack for last 15 years. On nicotine patch. Does not have any tremors,alert and oriented at present. Vital signs stable.Not withdrawing at the moment. Continue thiamine and folic acid.      DVT prophylaxis: Heparin [place] Code Status: Full Family Communication: None presented at the bedside Disposition Plan: Assisted living in 1-2 days   Consultants: None  Procedures: None  Antimicrobials: Augmentin  Subjective: Patient seen and examined the bedside this morning.  Remains comfortable.   We tried to monitor him off oxygen today.  He saturated 90% on room air on rest but his saturation dropped to 83% on room air while ambulating. Wheezes are improving.  Denies any shortness of breath at rest.    objective: Vitals:   01/03/18 0525 01/03/18 0834 01/03/18 1344 01/03/18 1347  BP: (!) 135/95  (!) 136/91  Pulse: 70  71   Resp: 13  13   Temp: 98.2 F (36.8 C)  98 F (36.7 C)   TempSrc: Oral   Oral   SpO2: 98% 92% 90% (!) 83%  Weight: 100.7 kg (222 lb)       Intake/Output Summary (Last 24 hours) at 01/03/2018 1502 Last data filed at 01/03/2018 4098 Gross per 24 hour  Intake 718 ml  Output -  Net 718 ml   Filed Weights   01/01/18 0401 01/02/18 0401 01/03/18 0525  Weight: 100.4 kg (221 lb 5.5 oz) 101 kg (222 lb 10.6 oz) 100.7 kg (222 lb)    Examination:  General exam: Appears calm and comfortable ,Not in distress,average built Respiratory system: Bilateral decreased air entry, bilateral occasional wheezes  cardiovascular system: S1 & S2 heard, RRR. No JVD, murmurs, rubs, gallops or clicks.  Gastrointestinal system: Abdomen is nondistended, soft and nontender. No organomegaly or masses felt. Normal bowel sounds heard. Central nervous system: Alert and oriented. No focal neurological deficits. Extremities: No edema, no clubbing ,no cyanosis, distal peripheral pulses palpable. Skin: No cyanosis,No pallor,No Rash,No Ulcer Psychiatry: Judgement and insight appear normal. Mood & affect appropriate.  GU: No Foley    Data Reviewed: I have personally reviewed following labs and imaging studies  CBC: Recent Labs  Lab 12/28/17 0651 12/28/17 1053 12/29/17 0330 12/30/17 0411 12/31/17 0402  WBC 3.0*  --  6.4 7.7 6.8  NEUTROABS 2.5  --   --   --   --   HGB 9.1*  --  7.3* 7.0* 7.6*  HCT 26.7* 21.0* 21.6* 20.6* 22.7*  MCV 102.3*  --  103.8* 103.5* 97.8  PLT 100*  --  150 139* 144*   Basic Metabolic Panel: Recent Labs  Lab 12/28/17 0651 12/29/17 0330 12/30/17 0411  NA 136 137 138  K 3.1* 3.4* 3.5  CL 103 107 108  CO2 23 24 26   GLUCOSE 113* 100* 97  BUN 21* 14 18  CREATININE 0.99 0.97 0.98  CALCIUM 8.6* 8.2* 8.6*  MG 2.1 1.9  --    GFR: Estimated Creatinine Clearance: 113.8 mL/min (by C-G formula based on SCr of 0.98 mg/dL). Liver Function Tests: Recent Labs  Lab 12/28/17 0651  AST 100*  ALT 65*  ALKPHOS 116  BILITOT 0.6  PROT 6.2*  ALBUMIN 3.4*   No  results for input(s): LIPASE, AMYLASE in the last 168 hours. No results for input(s): AMMONIA in the last 168 hours. Coagulation Profile: Recent Labs  Lab 12/28/17 0651  INR 0.94   Cardiac Enzymes: Recent Labs  Lab 12/28/17 0651  CKTOTAL 985*  TROPONINI <0.03   BNP (last 3 results) No results for input(s): PROBNP in the last 8760 hours. HbA1C: No results for input(s): HGBA1C in the last 72 hours. CBG: Recent Labs  Lab 12/28/17 0649  GLUCAP 106*   Lipid Profile: No results for input(s): CHOL, HDL, LDLCALC, TRIG, CHOLHDL, LDLDIRECT in the last 72 hours. Thyroid Function Tests: No results for input(s): TSH, T4TOTAL, FREET4, T3FREE, THYROIDAB in the last 72 hours. Anemia Panel: No results for input(s): VITAMINB12, FOLATE, FERRITIN, TIBC, IRON, RETICCTPCT in the last 72 hours. Sepsis Labs: No results for input(s): PROCALCITON, LATICACIDVEN in the last 168 hours.  Recent Results (from the past 240 hour(s))  MRSA PCR Screening     Status: None   Collection Time: 12/28/17 10:05 AM  Result Value Ref Range Status   MRSA by PCR NEGATIVE NEGATIVE Final    Comment:  The GeneXpert MRSA Assay (FDA approved for NASAL specimens only), is one component of a comprehensive MRSA colonization surveillance program. It is not intended to diagnose MRSA infection nor to guide or monitor treatment for MRSA infections.          Radiology Studies: Dg Chest 1 View  Result Date: 01/01/2018 CLINICAL DATA:  Dyspnea EXAM: CHEST 1 VIEW COMPARISON:  12/29/2017 and priors 01/26/2018 FINDINGS: Borderline cardiomegaly. No aortic aneurysm. Upper lobe emphysematous hyperinflation of the lungs, right greater than left with crowding of lower lobe interstitial lung markings, similar in appearance to prior. Resultant hazy opacities at the lung bases right greater than left. Superimposed minimal pneumonia is not entirely excluded especially along the medial aspect of the right lung base. No  effusion or pneumothorax. No acute osseous abnormality. IMPRESSION: Redemonstration of upper lobe predominant emphysema, right greater than left with resultant crowding of lower lobe interstitial lung markings. Slightly more confluent appearing bibasilar airspace opacities right greater than left cannot exclude the possibility of subtle pneumonia. Findings are similar to recent comparisons. Electronically Signed   By: Tollie Eth M.D.   On: 01/01/2018 16:21        Scheduled Meds: . amoxicillin-clavulanate  1 tablet Oral Q12H  . famotidine  20 mg Oral BID  . ferrous sulfate  325 mg Oral Q breakfast  . folic acid  1 mg Oral Daily  . heparin  5,000 Units Subcutaneous Q8H  . ipratropium-albuterol  3 mL Nebulization BID  . levothyroxine  100 mcg Oral QAC breakfast  . methylPREDNISolone (SOLU-MEDROL) injection  40 mg Intravenous Q12H  . nicotine  21 mg Transdermal Daily  . thiamine  100 mg Oral Daily   Continuous Infusions:   LOS: 6 days    Time spent: 25  minutes    Shariece Viveiros Salli Quarry, MD [facility] Pager [phone]  If 7PM-7AM, please contact night-coverage [email or web address] Password [facility] 01/03/2018, 3:02 PM

## 2018-01-03 NOTE — Progress Notes (Signed)
PROGRESS NOTE    Bruce Simpson  ZOX:[id number] DOB: 05-Dec-1966 DOA: 12/28/2017 PCP: Patient, No Pcp Per   Brief Narrative: Patient is a 73 year oldhomelessmale who was brought to the emergency department for evaluation of hypothermia and altered mental status. He was also combative on presentation. Patient also found to be masturbating outside homeless shelter. In the emergency department was found to be hypotensive and hypothermic. He has been admitted in the past with similar complaint. During his last admission his TSH was found to be 17 and he was treated with IV steroids and Synthyroid and then discharged on 12/22/17. Patient was also found to have possible pneumonia on this admission and was started on antibiotics.  Hypothermia resolved after he was treated conservatively with IV fluids and Bair hugger.  Since he has been smoking for last 15 years he might also have undiagnosed COPD.  He continues to have expiratory wheezes and his chest x-ray was also suggestive of emphysematous changes . Patient's overall status has improved.  He qualified for home oxygen.   Patient was planned for  discharge to shelter but patient said that he does not want to use oxygen anymore as he feels fine.Patient easily desaturates to 80s during ambulation. Since he is almost certain not to use home oxygen we decided to cancel the discharge and monitor him few days more if he does not need oxygen on discharge.   Assessment & Plan:   Principal Problem:   Acute hypoxemic respiratory failure (HCC) Active Problems:   Hypothermia   Acute metabolic encephalopathy   Macrocytic anemia   Hypokalemia   Hypothyroidism   Marijuana abuse   Aspiration pneumonia (HCC)    Acute hypoxic respiratory failure: Likely secondary to aspiration pneumonia/COPD exacerbation. Chest x-ray showed bilateral lower lobe infiltration. Currently he needs supplemental oxygen for saturation maintenance. He needs home oxygen on  discharge. Continue antibiotics. Currently on Augmentin.  Hypothermia/bradycardia/hypotension: Was treated conservatively with IV fluids and Bair hugger. Temperature has improved. Hypothermia most likely secondary to cold exposure. He was [place] the woods.  Iron deficiency anemia/low folic acid level: Status post transfusion with 1 unit of PRBC. Started on iron supplement and folic acid.  Acute metabolic encephalopathy:Complaint of visual hallucination yesterday. Currently he is alert and oriented. No visual hallucination during my evaluation.  UDSwaspositive for marijuana on presentation. Mental status currently stable.  Hypothyroidism: Continue Synthyroid.  COPD: Imagings also suggestive of this . He is a chronic smoker. Auscultation revealed bilateral wheezes. Continue albuterol inhaler on discharge.  He will be discharged on tapering dose of prednisone.  Chronic alcohol abuse/smoker: Drinks daily. Last drink was about 3 weeks ago as per the patient. Smokes a pack for last 15 years. On nicotine patch. Does not have any tremors,alert and oriented at present. Vital signs stable.Not withdrawing at the moment. Continue thiamine and folic acid.      DVT prophylaxis: Heparin [place] Code Status: Full Family Communication: None presented at the bedside Disposition Plan: Assisted living in 1-2 days   Consultants: None  Procedures: None  Antimicrobials: Augmentin  Subjective: Patient seen and examined the bedside this morning.  Appears comfortable.  Currently alert and oriented during my evaluation.  Still has mild bilateral expiratory wheezes.  Objective: Vitals:   01/03/18 0525 01/03/18 0834 01/03/18 1344 01/03/18 1347  BP: (!) 135/95  (!) 136/91   Pulse: 70  71   Resp: 13  13   Temp: 98.2 F (36.8 C)  98 F (36.7 C)   TempSrc: Oral  Oral   SpO2: 98% 92% 90% (!) 83%  Weight: 100.7 kg (222 lb)       Intake/Output Summary (Last 24 hours) at  01/03/2018 1453 Last data filed at 01/03/2018 4098 Gross per 24 hour  Intake 718 ml  Output -  Net 718 ml   Filed Weights   01/01/18 0401 01/02/18 0401 01/03/18 0525  Weight: 100.4 kg (221 lb 5.5 oz) 101 kg (222 lb 10.6 oz) 100.7 kg (222 lb)    Examination:  General exam: Appears calm and comfortable ,Not in distress,average built Respiratory system: Bilateral decreased air entry, occasional bilateral expiratory wheezes  cardiovascular system: S1 & S2 heard, RRR. No JVD, murmurs, rubs, gallops or clicks. No pedal edema. Gastrointestinal system: Abdomen is nondistended, soft and nontender. No organomegaly or masses felt. Normal bowel sounds heard. Central nervous system: Alert and oriented. No focal neurological deficits. Extremities: No edema, no clubbing ,no cyanosis, distal peripheral pulses palpable. Skin: No cyanosis,No pallor,No Rash,No Ulcer Psychiatry: Judgement and insight appear normal. Mood & affect appropriate.      Data Reviewed: I have personally reviewed following labs and imaging studies  CBC: Recent Labs  Lab 12/28/17 0651 12/28/17 1053 12/29/17 0330 12/30/17 0411 12/31/17 0402  WBC 3.0*  --  6.4 7.7 6.8  NEUTROABS 2.5  --   --   --   --   HGB 9.1*  --  7.3* 7.0* 7.6*  HCT 26.7* 21.0* 21.6* 20.6* 22.7*  MCV 102.3*  --  103.8* 103.5* 97.8  PLT 100*  --  150 139* 144*   Basic Metabolic Panel: Recent Labs  Lab 12/28/17 0651 12/29/17 0330 12/30/17 0411  NA 136 137 138  K 3.1* 3.4* 3.5  CL 103 107 108  CO2 23 24 26   GLUCOSE 113* 100* 97  BUN 21* 14 18  CREATININE 0.99 0.97 0.98  CALCIUM 8.6* 8.2* 8.6*  MG 2.1 1.9  --    GFR: Estimated Creatinine Clearance: 113.8 mL/min (by C-G formula based on SCr of 0.98 mg/dL). Liver Function Tests: Recent Labs  Lab 12/28/17 0651  AST 100*  ALT 65*  ALKPHOS 116  BILITOT 0.6  PROT 6.2*  ALBUMIN 3.4*   No results for input(s): LIPASE, AMYLASE in the last 168 hours. No results for input(s): AMMONIA in  the last 168 hours. Coagulation Profile: Recent Labs  Lab 12/28/17 0651  INR 0.94   Cardiac Enzymes: Recent Labs  Lab 12/28/17 0651  CKTOTAL 985*  TROPONINI <0.03   BNP (last 3 results) No results for input(s): PROBNP in the last 8760 hours. HbA1C: No results for input(s): HGBA1C in the last 72 hours. CBG: Recent Labs  Lab 12/28/17 0649  GLUCAP 106*   Lipid Profile: No results for input(s): CHOL, HDL, LDLCALC, TRIG, CHOLHDL, LDLDIRECT in the last 72 hours. Thyroid Function Tests: No results for input(s): TSH, T4TOTAL, FREET4, T3FREE, THYROIDAB in the last 72 hours. Anemia Panel: No results for input(s): VITAMINB12, FOLATE, FERRITIN, TIBC, IRON, RETICCTPCT in the last 72 hours. Sepsis Labs: No results for input(s): PROCALCITON, LATICACIDVEN in the last 168 hours.  Recent Results (from the past 240 hour(s))  MRSA PCR Screening     Status: None   Collection Time: 12/28/17 10:05 AM  Result Value Ref Range Status   MRSA by PCR NEGATIVE NEGATIVE Final    Comment:        The GeneXpert MRSA Assay (FDA approved for NASAL specimens only), is one component of a comprehensive MRSA colonization surveillance program. It is  not intended to diagnose MRSA infection nor to guide or monitor treatment for MRSA infections.          Radiology Studies: Dg Chest 1 View  Result Date: 01/01/2018 CLINICAL DATA:  Dyspnea EXAM: CHEST 1 VIEW COMPARISON:  12/29/2017 and priors 01/26/2018 FINDINGS: Borderline cardiomegaly. No aortic aneurysm. Upper lobe emphysematous hyperinflation of the lungs, right greater than left with crowding of lower lobe interstitial lung markings, similar in appearance to prior. Resultant hazy opacities at the lung bases right greater than left. Superimposed minimal pneumonia is not entirely excluded especially along the medial aspect of the right lung base. No effusion or pneumothorax. No acute osseous abnormality. IMPRESSION: Redemonstration of upper lobe  predominant emphysema, right greater than left with resultant crowding of lower lobe interstitial lung markings. Slightly more confluent appearing bibasilar airspace opacities right greater than left cannot exclude the possibility of subtle pneumonia. Findings are similar to recent comparisons. Electronically Signed   By: Tollie Ethavid  Kwon M.D.   On: 01/01/2018 16:21        Scheduled Meds: . amoxicillin-clavulanate  1 tablet Oral Q12H  . famotidine  20 mg Oral BID  . ferrous sulfate  325 mg Oral Q breakfast  . folic acid  1 mg Oral Daily  . heparin  5,000 Units Subcutaneous Q8H  . ipratropium-albuterol  3 mL Nebulization BID  . levothyroxine  100 mcg Oral QAC breakfast  . methylPREDNISolone (SOLU-MEDROL) injection  40 mg Intravenous Q12H  . nicotine  21 mg Transdermal Daily  . thiamine  100 mg Oral Daily   Continuous Infusions:   LOS: 6 days    Time spent: 25  minutes    Yicel Shannon Salli QuarryAdhikari BK, MD [facility] Pager [phone]  If 7PM-7AM, please contact night-coverage [email or web address] Password [email or web address] 01/03/2018, 2:53 PM

## 2018-01-03 NOTE — Care Management Note (Signed)
Case Management Note  Patient Details  Name: Elana AlmBrian Edwin Hasty MRN: [id number] Date of Birth: Aug 28, 1966  Subjective/Objective:   Acute resp failure, hypothermia, COPD, ETOH/smoker               Action/Plan: Spoke to pt at bedside. He does not have a way to support having oxygen in the community. He is homeless and does not want to go to shelter. He is currently living in a tent. Will arrange MATCH for pt with copay override for medications. Waiting dc date before enter override of medications. [facility] Instruction are on pt's discharge instruction.    Expected Discharge Date:  01/02/18               Expected Discharge Plan:  Homeless Shelter  In-House Referral:  Clinical Social Work  Discharge planning Services  CM Consult, Medication Assistance, Follow-up appt scheduled  Post Acute Care Choice:  NA Choice offered to:  NA  DME Arranged:    DME Agency:     HH Arranged:  NA HH Agency:  NA  Status of Service:  In process, will continue to follow  If discussed at Long Length of Stay Meetings, dates discussed:    Additional Comments:  Elliot CousinShavis, Fadil Macmaster Ellen, RN 01/03/2018, 3:23 PM

## 2018-01-03 NOTE — Progress Notes (Signed)
Patients oxygen saturation at rest on room air 90%, patient ambulating in hall with out oxygen 83% and patient is short of breath. Patient encouraged to continue to wear oxygen at 3 liters and use incentive spirometer every 2 hours while awake. Will continue to monitor.

## 2018-01-04 MED ORDER — GUAIFENESIN ER 600 MG PO TB12
600.0000 mg | ORAL_TABLET | Freq: Two times a day (BID) | ORAL | Status: DC
  Administered 2018-01-04 – 2018-01-05 (×3): 600 mg via ORAL
  Filled 2018-01-04 (×3): qty 1

## 2018-01-04 MED ORDER — IPRATROPIUM-ALBUTEROL 0.5-2.5 (3) MG/3ML IN SOLN
3.0000 mL | Freq: Three times a day (TID) | RESPIRATORY_TRACT | Status: DC
  Administered 2018-01-04 – 2018-01-05 (×3): 3 mL via RESPIRATORY_TRACT
  Filled 2018-01-04 (×2): qty 3

## 2018-01-04 NOTE — Progress Notes (Signed)
PROGRESS NOTE    Bruce Simpson  ZOX:[id number] DOB: November 20, 1966 DOA: 12/28/2017 PCP: Patient, No Pcp Per   Brief Narrative: Patient is a 67 year oldhomelessmale who was brought to the emergency department for evaluation of hypothermia and altered mental status. He was also combative on presentation. Patient also found to be masturbating outside homeless shelter. In the emergency department was found to be hypotensive and hypothermic. He has been admitted in the past with similar complaint. During his last admission his TSH was found to be 17 and he was treated with IV steroids and Synthyroid and then discharged on 12/22/17. Patient was also found to have possible pneumonia on this admission and was started on antibiotics.  Hypothermia resolved after he was treated conservatively with IV fluids and Bair hugger.  Since he has been smoking for last 15 years he might also have undiagnosed COPD.  He continues to have expiratory wheezes and his chest x-ray was also suggestive of emphysematous changes . Patient's overall status has improved.  He qualified for home oxygen.   Patient was planned for  discharge to shelter but patient said that he does not want to use oxygen anymore as he feels fine.Patient easily desaturates to 80s during ambulation. Since he is almost certain not to use home oxygen we decided to cancel the discharge and monitor him few days more if he does not need oxygen on discharge.   Assessment & Plan:   Principal Problem:   Acute hypoxemic respiratory failure (HCC) Active Problems:   Hypothermia   Acute metabolic encephalopathy   Macrocytic anemia   Hypokalemia   Hypothyroidism   Marijuana abuse   Aspiration pneumonia (HCC)    Acute hypoxic respiratory failure: Respiratory status much stable.  Acute respiratory failure on presentation likely secondary to aspiration pneumonia/COPD exacerbation. Chest x-ray showed bilateral lower lobe infiltration. Currently he  needs supplemental oxygen for saturation maintenance. He actually qualified for for  home oxygen on discharge. But since patient is homeless and lives in tents, it is almost impossible to discharge him with oxygen. He says he does not want to use oxygen after discharge.  He is noncompliant. We are  monitoring if he does not need supplemental oxygen.  We will try to wean off oxygen. But since patient might have undiagnosed COPD and he might have chronic respiratory insufficiency, he might eventually be needing oxygen. We will discuss with social worker and case manager tomorrow how we can manage this  Situation. Continue antibiotics. Currently on Augmentin.  Hypothermia/bradycardia/hypotension: Was treated conservatively with IV fluids and Bair hugger. Temperature has improved. Hypothermia most likely secondary to cold exposure. [place]  Iron deficiency anemia/low folic acid level: Status post transfusion with 1 unit of PRBC. Started on iron supplement and folic acid.  Acute metabolic encephalopathy:Resolved.. Currently he is alert and oriented. No visual hallucination during my evaluation.  UDSwaspositive for marijuana on presentation. Mental status currently stable.  Hypothyroidism: Continue Synthyroid.  COPD: Imagings also suggestive of this . He is a chronic smoker.  Lung sounds much better today.cntinue albuterol inhaler on discharge.  Continue IV steroids for now. He will be discharged on tapering dose of prednisone.  Chronic alcohol abuse/smoker: Drinks daily. Last drink was about 3 weeks ago as per the patient. Smokes a pack for last 15 years. On nicotine patch. Does not have any tremors,alert and oriented at present. Vital signs stable.Not withdrawing at the moment. Continue thiamine and folic acid.      DVT prophylaxis:  Heparin [place] Code Status: Full Family Communication: None presented at the bedside Disposition Plan: Assisted  living in 1-2 days   Consultants: None  Procedures: None  Antimicrobials: Augmentin  Subjective: Patient seen and examined the bedside this morning.  Remains comfortable.  He still has some cough.  Ordered Mucinex.  Respiratory status has improved.  Less wheezes heard today.    objective: Vitals:   01/04/18 0500 01/04/18 0822 01/04/18 0826 01/04/18 0830  BP:      Pulse:      Resp:      Temp:      TempSrc:      SpO2:  98% 98% 96%  Weight: 99.8 kg (220 lb)     Height:        Intake/Output Summary (Last 24 hours) at 01/04/2018 1243 Last data filed at 01/03/2018 1852 Gross per 24 hour  Intake 600 ml  Output -  Net 600 ml   Filed Weights   01/02/18 0401 01/03/18 0525 01/04/18 0500  Weight: 101 kg (222 lb 10.6 oz) 100.7 kg (222 lb) 99.8 kg (220 lb)    Examination:  General exam: Appears calm and comfortable ,Not in distress,average built Respiratory system: Bilateral decreased air entry, bilateral occasional wheezes  cardiovascular system: S1 & S2 heard, RRR. No JVD, murmurs, rubs, gallops or clicks.  Gastrointestinal system: Abdomen is nondistended, soft and nontender. No organomegaly or masses felt. Normal bowel sounds heard. Central nervous system: Alert and oriented. No focal neurological deficits. Extremities: No edema, no clubbing ,no cyanosis, distal peripheral pulses palpable. Skin: No cyanosis,No pallor,No Rash,No Ulcer Psychiatry: Judgement and insight appear normal. Mood & affect appropriate.  GU: No Foley    Data Reviewed: I have personally reviewed following labs and imaging studies  CBC: Recent Labs  Lab 12/29/17 0330 12/30/17 0411 12/31/17 0402  WBC 6.4 7.7 6.8  HGB 7.3* 7.0* 7.6*  HCT 21.6* 20.6* 22.7*  MCV 103.8* 103.5* 97.8  PLT 150 139* 144*   Basic Metabolic Panel: Recent Labs  Lab 12/29/17 0330 12/30/17 0411  NA 137 138  K 3.4* 3.5  CL 107 108  CO2 24 26  GLUCOSE 100* 97  BUN 14 18  CREATININE 0.97 0.98  CALCIUM 8.2* 8.6*    MG 1.9  --    GFR: Estimated Creatinine Clearance: 113.4 mL/min (by C-G formula based on SCr of 0.98 mg/dL). Liver Function Tests: No results for input(s): AST, ALT, ALKPHOS, BILITOT, PROT, ALBUMIN in the last 168 hours. No results for input(s): LIPASE, AMYLASE in the last 168 hours. No results for input(s): AMMONIA in the last 168 hours. Coagulation Profile: No results for input(s): INR, PROTIME in the last 168 hours. Cardiac Enzymes: No results for input(s): CKTOTAL, CKMB, CKMBINDEX, TROPONINI in the last 168 hours. BNP (last 3 results) No results for input(s): PROBNP in the last 8760 hours. HbA1C: No results for input(s): HGBA1C in the last 72 hours. CBG: No results for input(s): GLUCAP in the last 168 hours. Lipid Profile: No results for input(s): CHOL, HDL, LDLCALC, TRIG, CHOLHDL, LDLDIRECT in the last 72 hours. Thyroid Function Tests: No results for input(s): TSH, T4TOTAL, FREET4, T3FREE, THYROIDAB in the last 72 hours. Anemia Panel: No results for input(s): VITAMINB12, FOLATE, FERRITIN, TIBC, IRON, RETICCTPCT in the last 72 hours. Sepsis Labs: No results for input(s): PROCALCITON, LATICACIDVEN in the last 168 hours.  Recent Results (from the past 240 hour(s))  MRSA PCR Screening     Status: None   Collection Time: 12/28/17 10:05 AM  Result Value Ref Range Status   MRSA by PCR NEGATIVE NEGATIVE Final    Comment:        The GeneXpert MRSA Assay (FDA approved for NASAL specimens only), is one component of a comprehensive MRSA colonization surveillance program. It is not intended to diagnose MRSA infection nor to guide or monitor treatment for MRSA infections.          Radiology Studies: No results found.      Scheduled Meds: . amoxicillin-clavulanate  1 tablet Oral Q12H  . famotidine  20 mg Oral BID  . ferrous sulfate  325 mg Oral Q breakfast  . folic acid  1 mg Oral Daily  . guaiFENesin  600 mg Oral BID  . heparin  5,000 Units Subcutaneous Q8H  .  ipratropium-albuterol  3 mL Nebulization TID  . levothyroxine  100 mcg Oral QAC breakfast  . methylPREDNISolone (SOLU-MEDROL) injection  40 mg Intravenous Q12H  . nicotine  21 mg Transdermal Daily  . thiamine  100 mg Oral Daily   Continuous Infusions:   LOS: 7 days    Time spent: 25  minutes    Shaunak Kreis Salli QuarryAdhikari BK, MD [facility] Pager [phone]  If 7PM-7AM, please contact night-coverage [email or web address] Password [email or web address] 01/04/2018, 12:43 PM

## 2018-01-05 ENCOUNTER — Other Ambulatory Visit: Payer: Self-pay

## 2018-01-05 MED ORDER — IBUPROFEN 200 MG PO TABS
600.0000 mg | ORAL_TABLET | Freq: Once | ORAL | Status: AC
  Administered 2018-01-05: 600 mg via ORAL
  Filled 2018-01-05: qty 3

## 2018-01-05 MED ORDER — GUAIFENESIN ER 600 MG PO TB12: 600.0000 mg | ORAL_TABLET | Freq: Two times a day (BID) | ORAL | 0 refills | Status: DC

## 2018-01-05 MED ORDER — ALBUTEROL SULFATE HFA 108 (90 BASE) MCG/ACT IN AERS
2.0000 | INHALATION_SPRAY | Freq: Four times a day (QID) | RESPIRATORY_TRACT | Status: DC | PRN
  Filled 2018-01-05: qty 6.7

## 2018-01-05 MED FILL — LEVOTHYROXINE 100 MCG TAB: 100 | 30 days supply | Qty: 30 | Fill #0

## 2018-01-05 MED FILL — FOLIC ACID 1 MG TABLET: 1 | 30 days supply | Qty: 30 | Fill #0

## 2018-01-05 MED FILL — VENTOLIN HFA 90 MCG INHALER: 108 (90 BAS | 25 days supply | Qty: 18 | Fill #0

## 2018-01-05 MED FILL — FAMOTIDINE 20 MG TABLET: 20 | 7 days supply | Qty: 14 | Fill #0

## 2018-01-05 MED FILL — predniSONE 20 MG TABS: 20 | 9 days supply | Qty: 12 | Fill #0

## 2018-01-05 MED FILL — AMOX TR-K CLV 875-125 MG TA: 875-125 | 2 days supply | Qty: 5 | Fill #0

## 2018-01-05 NOTE — Progress Notes (Signed)
MATCH letter provided to pt. Over-ride done for pt so he would not have $3 copay.  Pt was [facility] to [facility] since he will be taking the bus home and that pharmacy is just past the bus stop. Pt states he will do this.  Sandford Crazeora Alashia Brownfield RN,BSN,NCM [phone]

## 2018-01-15 NOTE — Congregational Nurse Program (Signed)
Congregational Nurse Program Note  Date of Encounter: 01/09/2018  Past Medical History: Past Medical History:  Diagnosis Date  . Depression   . 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 Hypothyroidism   . Thyroid disease    "suppose to take RX; don't" (12/17/2017)    Encounter Details: CNP Questionnaire - 01/09/18 1533      Questionnaire   Patient Status  Not Applicable    Race  Black or African American    Location Patient Served At  Not Applicable    Uninsured  Uninsured (NEW 1x/quarter)    Food  Yes, have food insecurities;Within past 12 months, worried food would run out with no money to buy more    Housing/Utilities  No permanent housing    Transportation  No transportation needs;Provided transportation assistance (bus pass, taxi voucher, etc.)    Interpersonal Safety  No, do not feel physically and emotionally safe where you currently live    Medication  Yes, have medication insecurities    Medical Provider  Yes    Referrals  Area Agency    ED Visit Averted  Not Applicable    Life-Saving Intervention Made  Not Applicable      Client was given a taxi voucher [facility].  He was returned as his medications were not in order according to their policies.  Client's provider Lavinia SharpsMary Ann Placey NP notified.  She provided the needed information.  [facility]

## 2018-01-15 NOTE — Congregational Nurse Program (Signed)
Congregational Nurse Program Note  Date of Encounter: 01/12/2018  Past Medical History: Past Medical History:  Diagnosis Date  . Depression   . 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 Hypothyroidism   . Thyroid disease    "suppose to take RX; don't" (12/17/2017)    Encounter Details: CNP Questionnaire - 01/12/18 1538      Questionnaire   Patient Status  Not Applicable    Race  Black or African American    Location Patient Served At  Not Applicable    Insurance  Not Applicable    Uninsured  Uninsured (Subsequent visits/quarter)    Food  Yes, have food insecurities;Within past 12 months, worried food would run out with no money to buy more    Housing/Utilities  No permanent housing    Transportation  Provided transportation assistance (bus pass, taxi voucher, etc.);Yes, need transportation assistance    Interpersonal Safety  No, do not feel physically and emotionally safe where you currently live    Medication  Yes, have medication insecurities    Medical Provider  Yes    Referrals  Area Agency    ED Visit Averted  Not Applicable    Life-Saving Intervention Made  Not Applicable      Client provided with taxi voucher for [facility].  [facility] notified, all paper work has been obtained by them.  Client is ready for admission

## 2018-02-05 ENCOUNTER — Encounter (HOSPITAL_BASED_OUTPATIENT_CLINIC_OR_DEPARTMENT_OTHER): Payer: Self-pay

## 2018-02-05 ENCOUNTER — Other Ambulatory Visit: Payer: Self-pay

## 2018-02-05 ENCOUNTER — Emergency Department (HOSPITAL_BASED_OUTPATIENT_CLINIC_OR_DEPARTMENT_OTHER)
Admission: EM | Admit: 2018-02-05 | Discharge: 2018-02-05 | Disposition: A | Discharge status: Home / Self Care | Payer: Self-pay | Attending: Emergency Medicine | Admitting: Emergency Medicine

## 2018-02-05 DIAGNOSIS — E039 Hypothyroidism, unspecified: Secondary | ICD-10-CM | POA: Insufficient documentation

## 2018-02-05 DIAGNOSIS — Z79899 Other long term (current) drug therapy: Secondary | ICD-10-CM | POA: Insufficient documentation

## 2018-02-05 DIAGNOSIS — F17210 Nicotine dependence, cigarettes, uncomplicated: Secondary | ICD-10-CM | POA: Insufficient documentation

## 2018-02-05 DIAGNOSIS — L0201 Cutaneous abscess of face: Secondary | ICD-10-CM | POA: Insufficient documentation

## 2018-02-05 HISTORY — DX: Alcohol abuse, uncomplicated: F10.10

## 2018-02-05 MED ORDER — LIDOCAINE-EPINEPHRINE (PF) 2 %-1:200000 IJ SOLN
10.0000 mL | Freq: Once | INTRAMUSCULAR | Status: AC
  Administered 2018-02-05: 10 mL
  Filled 2018-02-05: qty 10

## 2018-02-05 NOTE — ED Notes (Signed)
Pt verbalizes understanding of d/c instructions and denies any further needs at this time. 

## 2018-02-05 NOTE — ED Provider Notes (Signed)
[facility] EMERGENCY DEPARTMENT Provider Note   CSN: [id number] Arrival date & time: 02/05/18  1804     History   Chief Complaint Chief Complaint  Patient presents with  . Cyst    HPI Bruce Simpson is a 52 y.o. male.  The history is provided by the patient.  Abscess  Location:  Face Facial abscess location:  R cheek Size:  Dime sized Abscess quality: draining, fluctuance, induration, painful and redness   Red streaking: no   Duration:  1 week Progression:  Unchanged Pain details:    Quality:  Throbbing and shooting   Severity:  Mild   Timing:  Constant   Progression:  Unchanged Chronicity:  New Context: not diabetes, not immunosuppression and not injected drug use   Relieved by:  None tried Worsened by:  Nothing Ineffective treatments:  None tried Associated symptoms: no fever   Risk factors: no hx of MRSA and no prior abscess     Past Medical History:  Diagnosis Date  . Depression   . ETOH abuse   . 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 Hypothyroidism   . Thyroid disease    "suppose to take RX; don't" (12/17/2017)    Patient Active Problem List   Diagnosis Date Noted  . Aspiration pneumonia (HCC) 12/30/2017  . Macrocytic anemia 12/28/2017  . Hypokalemia 12/28/2017  . Hypothyroidism 12/28/2017  . Marijuana abuse 12/28/2017  . Acute hypoxemic respiratory failure (HCC) 12/28/2017  . Hypothermia   . Acute metabolic encephalopathy   . Hypotension 12/16/2017    Past Surgical History:  Procedure Laterality Date  . NO PAST SURGERIES         Home Medications    Prior to Admission medications   Medication Sig Start Date End Date Taking? Authorizing Provider  albuterol (PROVENTIL HFA;VENTOLIN HFA) 108 (90 Base) MCG/ACT inhaler Inhale 2 puffs into the lungs every 6 (six) hours as needed for wheezing or shortness of breath. 01/02/18   Meredith Leeds, MD    amoxicillin-clavulanate (AUGMENTIN) 875-125 MG tablet Take 1 tablet by mouth every 12 (twelve) hours. 01/02/18   Meredith Leeds, MD  famotidine (PEPCID) 20 MG tablet Take 1 tablet (20 mg total) by mouth 2 (two) times daily. 01/02/18   Meredith Leeds, MD  ferrous sulfate 325 (65 FE) MG tablet Take 1 tablet (325 mg total) by mouth daily with breakfast. 01/03/18   Meredith Leeds, MD  folic acid (FOLVITE) 1 MG tablet Take 1 tablet (1 mg total) by mouth daily. 01/03/18   Meredith Leeds, MD  guaiFENesin (MUCINEX) 600 MG 12 hr tablet Take 1 tablet (600 mg total) by mouth 2 (two) times daily. 01/05/18   Meredith Leeds, MD  levothyroxine (SYNTHROID, LEVOTHROID) 100 MCG tablet Take 1 tablet (100 mcg total) by mouth daily before breakfast. 01/02/18   Salli Quarry, Amrit, MD  nicotine (NICODERM CQ - DOSED IN MG/24 HOURS) 14 mg/24hr patch Place 1 patch (14 mg total) onto the skin daily. 12/23/17   Briant Cedar, MD  predniSONE (DELTASONE) 20 MG tablet Take 40 mg daily for 3 days then 20 mg daily for 3 days then 10 mg daily for 3 days then stop. 01/02/18   Meredith Leeds, MD  thiamine 100 MG tablet Take 1 tablet (100 mg total) by mouth daily. 01/03/18   Meredith Leeds, MD    Family History No  family history on file.  Social History Social History   Tobacco Use  . Smoking status: Current Some Day Smoker    Packs/day: 1.00    Years: 34.00    Pack years: 34.00    Types: Cigarettes  . Smokeless tobacco: Former NeurosurgeonUser    Types: Snuff  Substance Use Topics  . Alcohol use: No    Frequency: Never    Comment: in rehab  . Drug use: No    Comment: in rehab     Allergies   Patient has no known allergies.   Review of Systems Review of Systems  Constitutional: Negative for fever.  All other systems reviewed and are negative.    Physical Exam Updated Vital Signs BP 127/86 (BP Location: Left Arm)   Pulse 86   Temp 97.9 F (36.6 C) (Oral)   Resp 18   Ht 6\' 1"  (1.854 m)    Wt 90 kg (198 lb 6.6 oz)   SpO2 98%   BMI 26.18 kg/m   Physical Exam  Constitutional: He is oriented to person, place, and time. He appears well-developed and well-nourished. No distress.  HENT:  Head: Normocephalic and atraumatic.    Eyes: Conjunctivae and EOM are normal. Pupils are equal, round, and reactive to light.  Neck: Normal range of motion. Neck supple.  Cardiovascular: Normal rate.  Pulmonary/Chest: Effort normal.  Musculoskeletal: Normal range of motion. He exhibits no edema or tenderness.  Neurological: He is alert and oriented to person, place, and time.  Skin: Skin is warm and dry. No rash noted. No erythema.  Psychiatric: He has a normal mood and affect. His behavior is normal.  Nursing note and vitals reviewed.    ED Treatments / Results  Labs (all labs ordered are listed, but only abnormal results are displayed) Labs Reviewed - No data to display  EKG  EKG Interpretation None       Radiology No results found.  Procedures Procedures (including critical care time) INCISION AND DRAINAGE Performed by: Gwyneth SproutWhitney Coyt Govoni Consent: Verbal consent obtained. Risks and benefits: risks, benefits and alternatives were discussed Type: abscess  Body area: Right cheek  Anesthesia: local infiltration  Incision was made with a scalpel.  Local anesthetic: lidocaine 2 % with epinephrine  Anesthetic total: 2 ml  Complexity: simple   Drainage: purulent  Drainage amount: 2mL Packing material: none  Patient tolerance: Patient tolerated the procedure well with no immediate complications.     Medications Ordered in ED Medications  lidocaine-EPINEPHrine (XYLOCAINE W/EPI) 2 %-1:200000 (PF) injection 10 mL (not administered)     Initial Impression / Assessment and Plan / ED Course  I have reviewed the triage vital signs and the nursing notes.  Pertinent labs & imaging results that were available during my care of the patient were reviewed by me and  considered in my medical decision making (see chart for details).     Patient with uncomplicated abscess of the right cheek.  I&D as above.  [facility].  Final Clinical Impressions(s) / ED Diagnoses   Final diagnoses:  Facial abscess    ED Discharge Orders    None       Gwyneth SproutPlunkett, Nicholis Stepanek, MD 02/05/18 2028

## 2018-02-05 NOTE — ED Notes (Signed)
Pt has gumball sized cyst to right cheek that is scabbed over for the last week.  No redness, warmth or swelling to surrounding skin, no fevers at home per patient.

## 2018-02-05 NOTE — ED Triage Notes (Signed)
C/o cyst to right cheek x 1 week[facility]-pt NAD-steady gait

## 2019-03-26 IMAGING — CT CT HEAD W/O CM
4 of 10 series · 15 of 47 positions shown, 16 images · non-contrast
Comparison: None.

CLINICAL DATA: Unwitnessed fall with altered level of consciousness

EXAM:
CT HEAD WITHOUT CONTRAST
TECHNIQUE: Contiguous axial images were obtained from the base of the skull
through the vertex without intravenous contrast.

[Series 5: head without · axial · non-contrast · 0.46mm/px · z∈[-125,+25]mm · 3 of 31 slices shown, 4 images]
[im 1/31  brain]
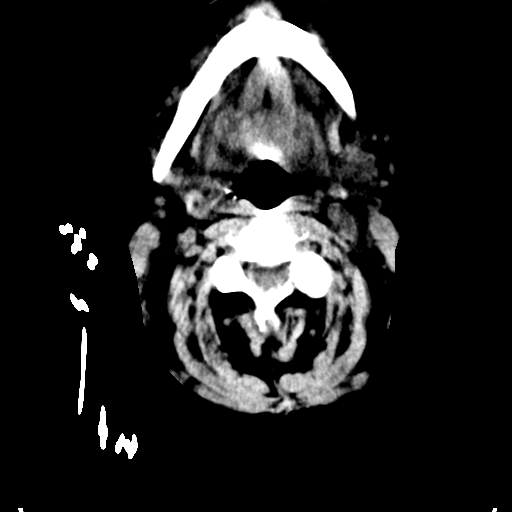
[im 1/31  bone]
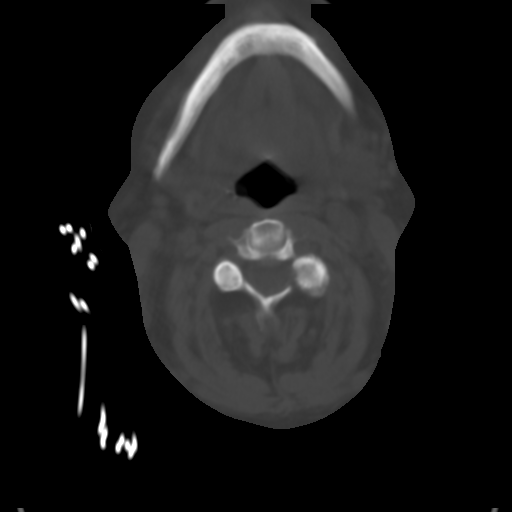
[im 16/31  brain]
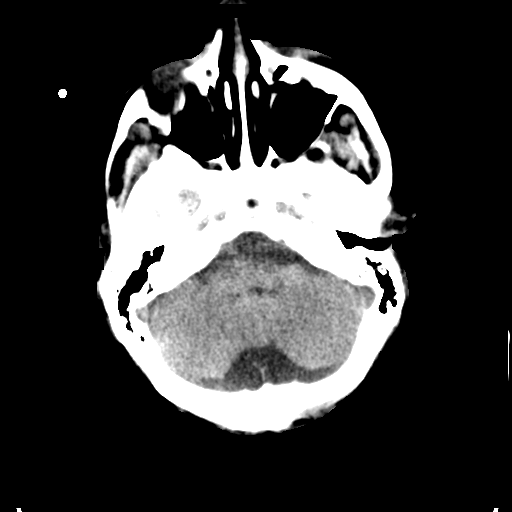
[im 31/31  brain]
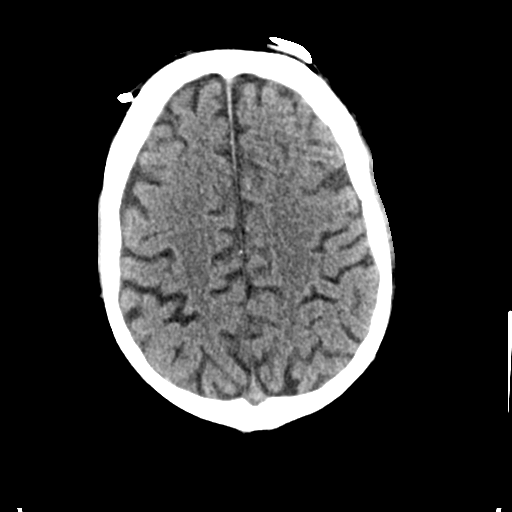

[Series 6: head bone · axial · 0.46mm/px · z∈[-107,+9]mm · 7 of 78 slices shown]
[im 10/78  bone]
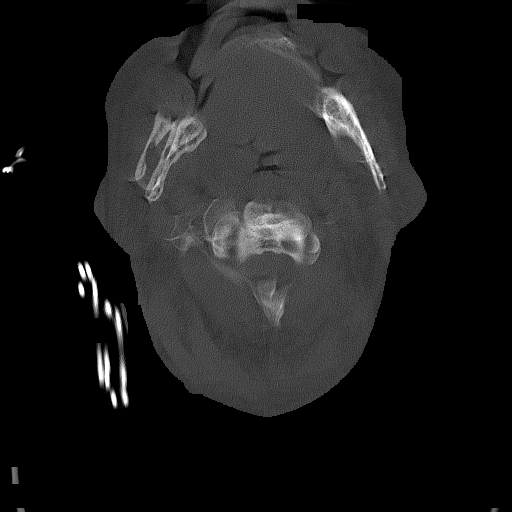
[im 20/78  bone]
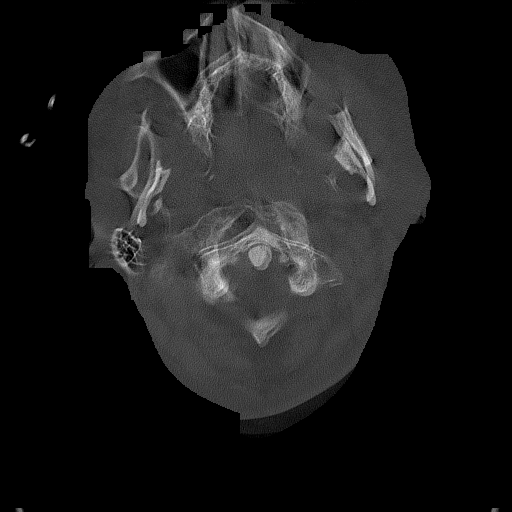
[im 29/78  bone]
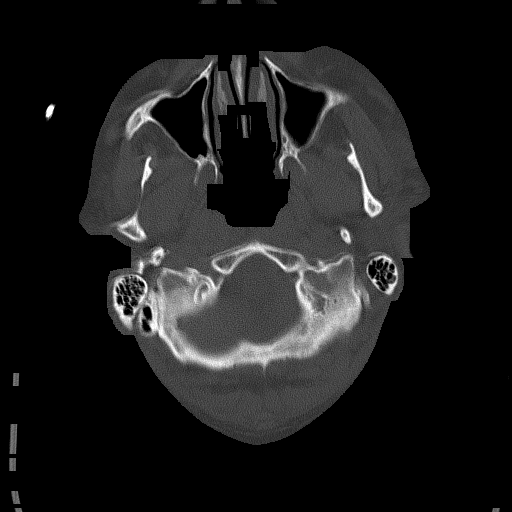
[im 39/78  bone]
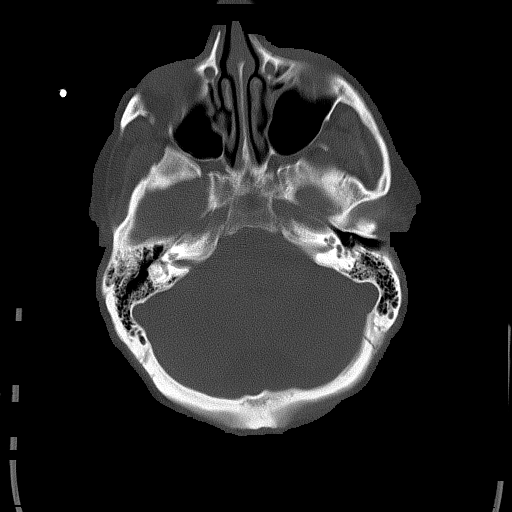
[im 49/78  bone]
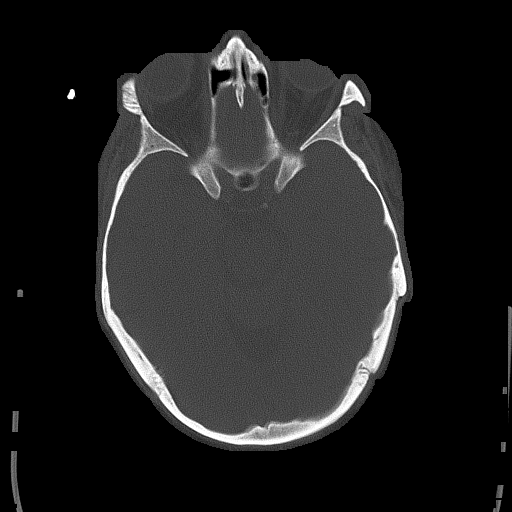
[im 58/78  bone]
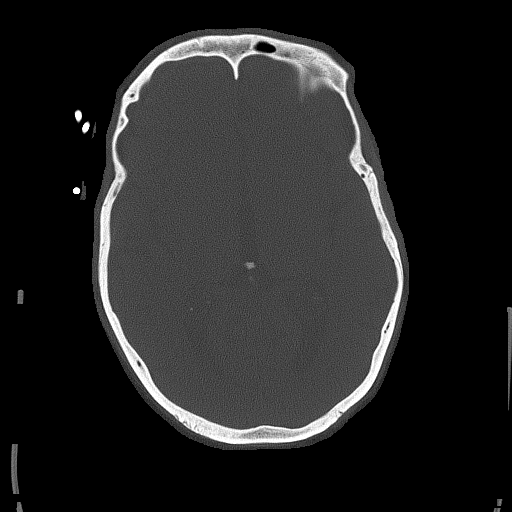
[im 68/78  bone]
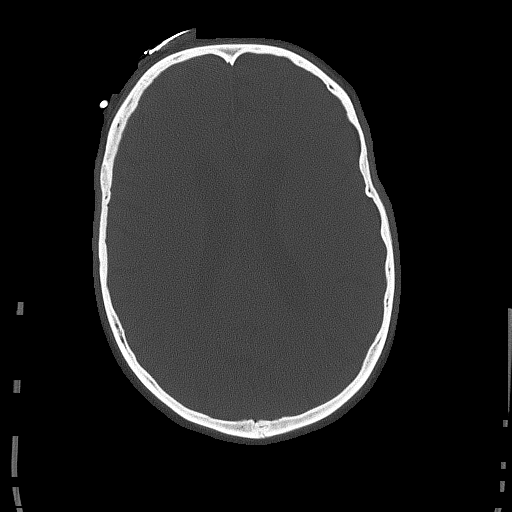

[Series 7: head without cor · coronal · non-contrast · 0.26mm/px · 3 of 75 slices shown]
[im 19/75  brain]
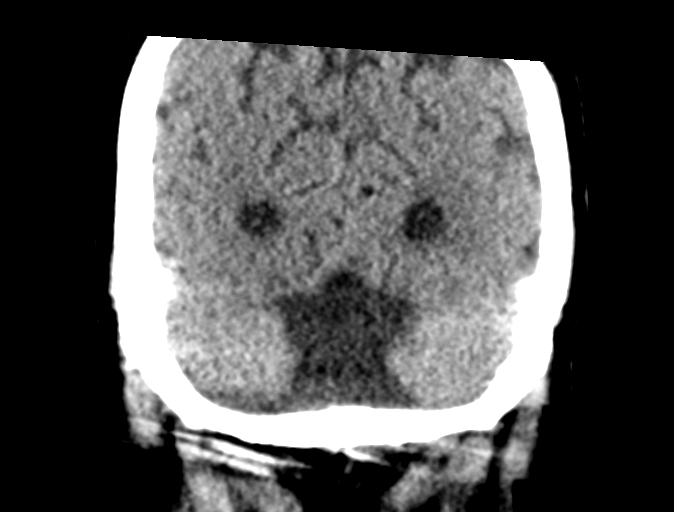
[im 38/75  brain]
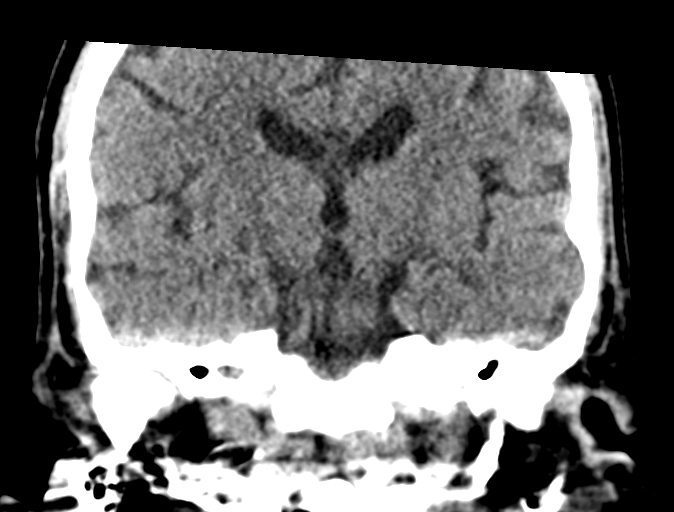
[im 56/75  brain]
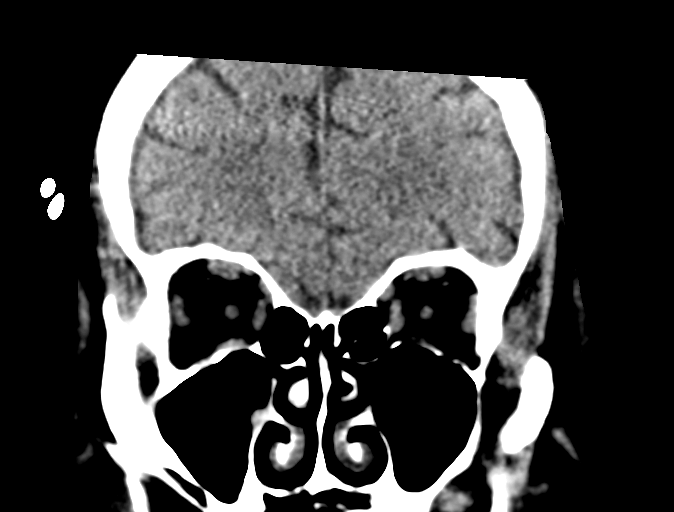

[Series 12: head without sag · sagittal · non-contrast · 0.27mm/px · 2 of 67 slices shown]
[im 23/67  brain]
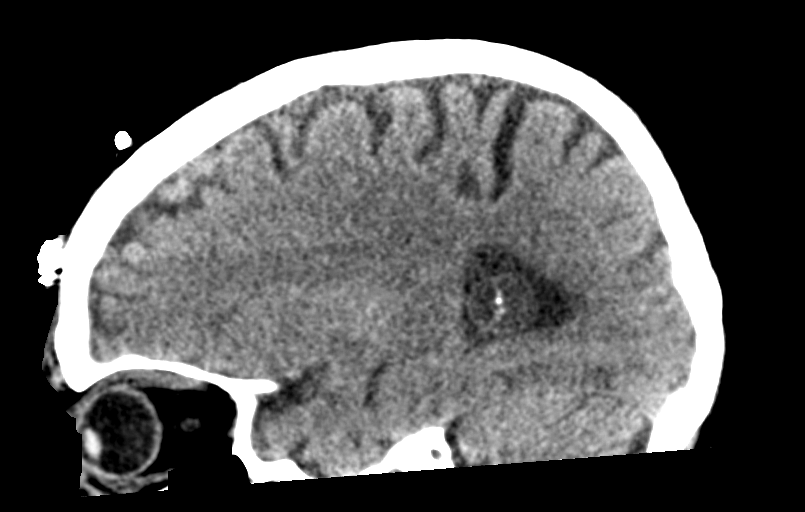
[im 45/67  brain]
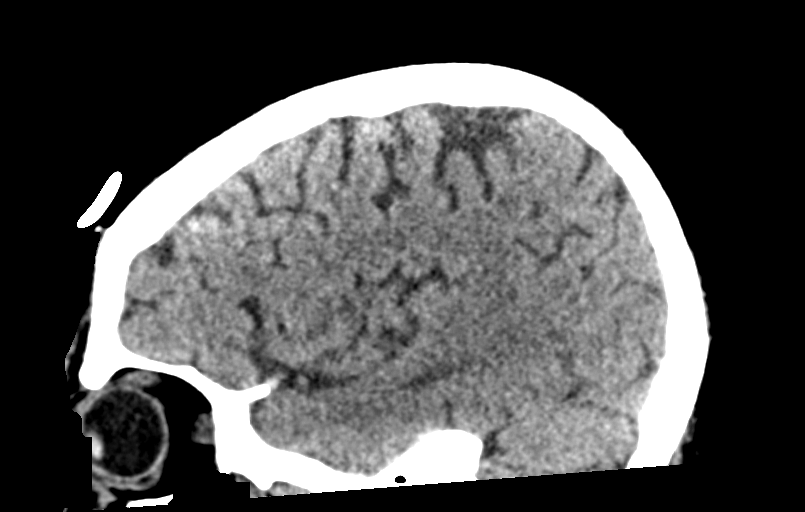

[15 of 47 positions shown; findings below may reference images not displayed]

FINDINGS: Brain: There is mild atrophy for age. There is prominence of the
cisterna magna, a presumed anatomic variant. There is no appreciable
intracranial mass, hemorrhage, extra-axial fluid collection, or
midline shift. There is minimal small vessel disease in the centra
semiovale bilaterally. Elsewhere gray-white compartments appear
normal. No evident acute infarct.

Vascular: No hyperdense vessel. There is mild calcification in the
right carotid siphon.

Skull: Bony calvarium appears intact.

Sinuses/Orbits: There is slight mucosal thickening in several
ethmoid air cells. There is a rather minimal retention cyst in the
medial left maxillary antrum. Orbits appear symmetric bilaterally.

Other: Mastoid air cells appear clear.
IMPRESSION: Mild atrophy for age. Minimal periventricular small vessel disease.
No mass, hemorrhage, or acute infarct.

Mild arterial vascular calcification.  Mild paranasal sinus disease.

## 2019-11-21 ENCOUNTER — Emergency Department (HOSPITAL_COMMUNITY): Payer: Self-pay

## 2019-11-21 ENCOUNTER — Encounter (HOSPITAL_COMMUNITY): Payer: Self-pay

## 2019-11-21 ENCOUNTER — Inpatient Hospital Stay (HOSPITAL_COMMUNITY)
Admission: EM | Admit: 2019-11-21 | Discharge: 2019-11-25 | DRG: 644 | Disposition: A | Discharge status: Home / Self Care | Payer: Self-pay | Attending: Internal Medicine | Admitting: Internal Medicine

## 2019-11-21 ENCOUNTER — Other Ambulatory Visit: Payer: Self-pay

## 2019-11-21 DIAGNOSIS — Z91120 Patient's intentional underdosing of medication regimen due to financial hardship: Secondary | ICD-10-CM

## 2019-11-21 DIAGNOSIS — Z23 Encounter for immunization: Secondary | ICD-10-CM

## 2019-11-21 DIAGNOSIS — F17210 Nicotine dependence, cigarettes, uncomplicated: Secondary | ICD-10-CM | POA: Diagnosis present

## 2019-11-21 DIAGNOSIS — E860 Dehydration: Secondary | ICD-10-CM | POA: Diagnosis present

## 2019-11-21 DIAGNOSIS — F1010 Alcohol abuse, uncomplicated: Secondary | ICD-10-CM | POA: Diagnosis present

## 2019-11-21 DIAGNOSIS — E876 Hypokalemia: Secondary | ICD-10-CM | POA: Diagnosis present

## 2019-11-21 DIAGNOSIS — Z20828 Contact with and (suspected) exposure to other viral communicable diseases: Secondary | ICD-10-CM | POA: Diagnosis present

## 2019-11-21 DIAGNOSIS — Z9114 Patient's other noncompliance with medication regimen: Secondary | ICD-10-CM

## 2019-11-21 DIAGNOSIS — J441 Chronic obstructive pulmonary disease with (acute) exacerbation: Secondary | ICD-10-CM | POA: Diagnosis present

## 2019-11-21 DIAGNOSIS — E871 Hypo-osmolality and hyponatremia: Secondary | ICD-10-CM | POA: Diagnosis present

## 2019-11-21 DIAGNOSIS — I959 Hypotension, unspecified: Secondary | ICD-10-CM | POA: Diagnosis present

## 2019-11-21 DIAGNOSIS — J189 Pneumonia, unspecified organism: Secondary | ICD-10-CM

## 2019-11-21 DIAGNOSIS — Z720 Tobacco use: Secondary | ICD-10-CM | POA: Diagnosis present

## 2019-11-21 DIAGNOSIS — T68XXXA Hypothermia, initial encounter: Secondary | ICD-10-CM | POA: Diagnosis present

## 2019-11-21 DIAGNOSIS — R680 Hypothermia, not associated with low environmental temperature: Secondary | ICD-10-CM | POA: Diagnosis present

## 2019-11-21 DIAGNOSIS — J439 Emphysema, unspecified: Secondary | ICD-10-CM | POA: Diagnosis present

## 2019-11-21 DIAGNOSIS — Z8249 Family history of ischemic heart disease and other diseases of the circulatory system: Secondary | ICD-10-CM

## 2019-11-21 DIAGNOSIS — Z5900 Homelessness unspecified: Secondary | ICD-10-CM

## 2019-11-21 DIAGNOSIS — T381X6A Underdosing of thyroid hormones and substitutes, initial encounter: Secondary | ICD-10-CM | POA: Diagnosis present

## 2019-11-21 DIAGNOSIS — R9431 Abnormal electrocardiogram [ECG] [EKG]: Secondary | ICD-10-CM | POA: Diagnosis present

## 2019-11-21 DIAGNOSIS — N179 Acute kidney failure, unspecified: Secondary | ICD-10-CM | POA: Diagnosis present

## 2019-11-21 DIAGNOSIS — E039 Hypothyroidism, unspecified: Principal | ICD-10-CM | POA: Diagnosis present

## 2019-11-21 DIAGNOSIS — Z825 Family history of asthma and other chronic lower respiratory diseases: Secondary | ICD-10-CM

## 2019-11-21 LAB — LACTIC ACID, PLASMA: Lactic Acid, Venous: 1.8 mmol/L (ref 0.5–1.9)

## 2019-11-21 LAB — CBC WITH DIFFERENTIAL/PLATELET
Abs Immature Granulocytes: 0.06 10*3/uL (ref 0.00–0.07)
Basophils Absolute: 0.1 10*3/uL (ref 0.0–0.1)
Basophils Relative: 1 %
Eosinophils Absolute: 0.2 10*3/uL (ref 0.0–0.5)
Eosinophils Relative: 2 %
HCT: 41 % (ref 39.0–52.0)
Hemoglobin: 14.4 g/dL (ref 13.0–17.0)
Immature Granulocytes: 1 %
Lymphocytes Relative: 31 %
Lymphs Abs: 2.4 10*3/uL (ref 0.7–4.0)
MCH: 36.7 pg — ABNORMAL HIGH (ref 26.0–34.0)
MCHC: 35.1 g/dL (ref 30.0–36.0)
MCV: 104.6 fL — ABNORMAL HIGH (ref 80.0–100.0)
Monocytes Absolute: 0.5 10*3/uL (ref 0.1–1.0)
Monocytes Relative: 7 %
Neutro Abs: 4.6 10*3/uL (ref 1.7–7.7)
Neutrophils Relative %: 58 %
Platelets: 404 10*3/uL — ABNORMAL HIGH (ref 150–400)
RBC: 3.92 MIL/uL — ABNORMAL LOW (ref 4.22–5.81)
RDW: 16.3 % — ABNORMAL HIGH (ref 11.5–15.5)
WBC: 7.7 10*3/uL (ref 4.0–10.5)
nRBC: 0 % (ref 0.0–0.2)

## 2019-11-21 LAB — CBC
HCT: 35.8 % — ABNORMAL LOW (ref 39.0–52.0)
Hemoglobin: 12.4 g/dL — ABNORMAL LOW (ref 13.0–17.0)
MCH: 36.7 pg — ABNORMAL HIGH (ref 26.0–34.0)
MCHC: 34.6 g/dL (ref 30.0–36.0)
MCV: 105.9 fL — ABNORMAL HIGH (ref 80.0–100.0)
Platelets: 321 10*3/uL (ref 150–400)
RBC: 3.38 MIL/uL — ABNORMAL LOW (ref 4.22–5.81)
RDW: 16.4 % — ABNORMAL HIGH (ref 11.5–15.5)
WBC: 8.8 10*3/uL (ref 4.0–10.5)
nRBC: 0 % (ref 0.0–0.2)

## 2019-11-21 LAB — PHOSPHORUS: Phosphorus: 2.8 mg/dL (ref 2.5–4.6)

## 2019-11-21 LAB — GLUCOSE, CAPILLARY: Glucose-Capillary: 101 mg/dL — ABNORMAL HIGH (ref 70–99)

## 2019-11-21 LAB — COMPREHENSIVE METABOLIC PANEL
ALT: 33 U/L (ref 0–44)
AST: 61 U/L — ABNORMAL HIGH (ref 15–41)
Albumin: 3.5 g/dL (ref 3.5–5.0)
Alkaline Phosphatase: 128 U/L — ABNORMAL HIGH (ref 38–126)
Anion gap: 12 (ref 5–15)
BUN: 6 mg/dL (ref 6–20)
CO2: 24 mmol/L (ref 22–32)
Calcium: 8.8 mg/dL — ABNORMAL LOW (ref 8.9–10.3)
Chloride: 94 mmol/L — ABNORMAL LOW (ref 98–111)
Creatinine, Ser: 1.14 mg/dL (ref 0.61–1.24)
GFR calc Af Amer: >60 mL/min (ref >60)
GFR calc non Af Amer: >60 mL/min (ref >60)
Glucose, Bld: 100 mg/dL — ABNORMAL HIGH (ref 70–99)
Potassium: 3.2 mmol/L — ABNORMAL LOW (ref 3.5–5.1)
Sodium: 130 mmol/L — ABNORMAL LOW (ref 135–145)
Total Bilirubin: 0.9 mg/dL (ref 0.3–1.2)
Total Protein: 7 g/dL (ref 6.5–8.1)

## 2019-11-21 LAB — TSH: TSH: 37.911 u[IU]/mL — ABNORMAL HIGH (ref 0.350–4.500)

## 2019-11-21 LAB — CREATININE, SERUM
Creatinine, Ser: 1.04 mg/dL (ref 0.61–1.24)
GFR calc Af Amer: >60 mL/min (ref >60)
GFR calc non Af Amer: >60 mL/min (ref >60)

## 2019-11-21 LAB — MAGNESIUM: Magnesium: 2.2 mg/dL (ref 1.7–2.4)

## 2019-11-21 LAB — CBG MONITORING, ED: Glucose-Capillary: 89 mg/dL (ref 70–99)

## 2019-11-21 LAB — POC SARS CORONAVIRUS 2 AG -  ED: SARS Coronavirus 2 Ag: NEGATIVE

## 2019-11-21 MED ORDER — ACETAMINOPHEN 325 MG PO TABS
650.0000 mg | ORAL_TABLET | Freq: Four times a day (QID) | ORAL | Status: DC | PRN
  Administered 2019-11-22: 650 mg via ORAL
  Filled 2019-11-21: qty 2

## 2019-11-21 MED ORDER — SODIUM CHLORIDE 0.9% FLUSH
3.0000 mL | Freq: Two times a day (BID) | INTRAVENOUS | Status: DC
  Administered 2019-11-21 – 2019-11-25 (×7): 3 mL via INTRAVENOUS

## 2019-11-21 MED ORDER — LORAZEPAM 2 MG/ML IJ SOLN: 1.0000 mg | INTRAMUSCULAR | Status: AC | PRN

## 2019-11-21 MED ORDER — MAGNESIUM HYDROXIDE 400 MG/5ML PO SUSP: 30.0000 mL | Freq: Every day | ORAL | Status: DC | PRN

## 2019-11-21 MED ORDER — NICOTINE 21 MG/24HR TD PT24
21.0000 mg | MEDICATED_PATCH | Freq: Every day | TRANSDERMAL | Status: DC
  Administered 2019-11-22 – 2019-11-25 (×5): 21 mg via TRANSDERMAL
  Filled 2019-11-21 (×5): qty 1

## 2019-11-21 MED ORDER — ENOXAPARIN SODIUM 40 MG/0.4ML ~~LOC~~ SOLN
40.0000 mg | SUBCUTANEOUS | Status: DC
  Administered 2019-11-21 – 2019-11-24 (×4): 40 mg via SUBCUTANEOUS
  Filled 2019-11-21 (×4): qty 0.4

## 2019-11-21 MED ORDER — ACETAMINOPHEN 650 MG RE SUPP: 650.0000 mg | Freq: Four times a day (QID) | RECTAL | Status: DC | PRN

## 2019-11-21 MED ORDER — UMECLIDINIUM BROMIDE 62.5 MCG/INH IN AEPB
1.0000 | INHALATION_SPRAY | Freq: Every day | RESPIRATORY_TRACT | Status: DC
  Administered 2019-11-23: 1 via RESPIRATORY_TRACT
  Filled 2019-11-21: qty 7

## 2019-11-21 MED ORDER — HYDROCORTISONE NA SUCCINATE PF 100 MG IJ SOLR
100.0000 mg | Freq: Three times a day (TID) | INTRAMUSCULAR | Status: DC
  Administered 2019-11-21 – 2019-11-23 (×5): 100 mg via INTRAVENOUS
  Filled 2019-11-21 (×5): qty 2

## 2019-11-21 MED ORDER — INFLUENZA VAC SPLIT QUAD 0.5 ML IM SUSY
0.5000 mL | PREFILLED_SYRINGE | INTRAMUSCULAR | Status: AC
  Administered 2019-11-22: 0.5 mL via INTRAMUSCULAR
  Filled 2019-11-21: qty 0.5

## 2019-11-21 MED ORDER — POTASSIUM CHLORIDE CRYS ER 20 MEQ PO TBCR
40.0000 meq | EXTENDED_RELEASE_TABLET | Freq: Once | ORAL | Status: AC
  Administered 2019-11-21: 18:00:00 40 meq via ORAL
  Filled 2019-11-21: qty 2

## 2019-11-21 MED ORDER — DOCUSATE SODIUM 100 MG PO CAPS
100.0000 mg | ORAL_CAPSULE | Freq: Two times a day (BID) | ORAL | Status: DC
  Administered 2019-11-21 – 2019-11-25 (×8): 100 mg via ORAL
  Filled 2019-11-21 (×8): qty 1

## 2019-11-21 MED ORDER — THIAMINE HCL 100 MG/ML IJ SOLN
Freq: Once | INTRAVENOUS | Status: AC
  Administered 2019-11-21: 22:00:00 via INTRAVENOUS
  Filled 2019-11-21: qty 1000

## 2019-11-21 MED ORDER — DOXYCYCLINE HYCLATE 100 MG PO TABS
100.0000 mg | ORAL_TABLET | Freq: Two times a day (BID) | ORAL | Status: DC
  Administered 2019-11-21 – 2019-11-25 (×8): 100 mg via ORAL
  Filled 2019-11-21 (×8): qty 1

## 2019-11-21 MED ORDER — SODIUM CHLORIDE 0.9 % IV SOLN
1.0000 g | Freq: Once | INTRAVENOUS | Status: AC
  Administered 2019-11-21: 1 g via INTRAVENOUS
  Filled 2019-11-21: qty 10

## 2019-11-21 MED ORDER — SODIUM CHLORIDE 0.9 % IV SOLN
500.0000 mg | Freq: Once | INTRAVENOUS | Status: DC
  Filled 2019-11-21: qty 500

## 2019-11-21 MED ORDER — SODIUM CHLORIDE 0.9 % IV SOLN: 1.0000 g | INTRAVENOUS | Status: DC

## 2019-11-21 MED ORDER — SODIUM CHLORIDE 0.9 % IV SOLN
1.0000 g | INTRAVENOUS | Status: DC
  Administered 2019-11-22 – 2019-11-24 (×3): 1 g via INTRAVENOUS
  Filled 2019-11-21: qty 1
  Filled 2019-11-21: qty 10
  Filled 2019-11-21: qty 1

## 2019-11-21 MED ORDER — FOLIC ACID 1 MG PO TABS
1.0000 mg | ORAL_TABLET | Freq: Every day | ORAL | Status: DC
  Administered 2019-11-22 – 2019-11-25 (×4): 1 mg via ORAL
  Filled 2019-11-21 (×4): qty 1

## 2019-11-21 MED ORDER — LORAZEPAM 1 MG PO TABS: 1.0000 mg | ORAL_TABLET | ORAL | Status: AC | PRN

## 2019-11-21 MED ORDER — GUAIFENESIN ER 600 MG PO TB12
600.0000 mg | ORAL_TABLET | Freq: Two times a day (BID) | ORAL | Status: DC
  Administered 2019-11-21 – 2019-11-25 (×8): 600 mg via ORAL
  Filled 2019-11-21 (×8): qty 1

## 2019-11-21 MED ORDER — THIAMINE HCL 100 MG PO TABS
100.0000 mg | ORAL_TABLET | Freq: Every day | ORAL | Status: DC
  Administered 2019-11-22 – 2019-11-25 (×4): 100 mg via ORAL
  Filled 2019-11-21 (×4): qty 1

## 2019-11-21 MED ORDER — IBUPROFEN 200 MG PO TABS
200.0000 mg | ORAL_TABLET | Freq: Four times a day (QID) | ORAL | Status: DC | PRN
  Administered 2019-11-22 (×2): 200 mg via ORAL
  Filled 2019-11-21 (×2): qty 1

## 2019-11-21 MED ORDER — LEVOTHYROXINE SODIUM 100 MCG PO TABS
100.0000 ug | ORAL_TABLET | Freq: Every day | ORAL | Status: DC
  Administered 2019-11-22 – 2019-11-25 (×4): 100 ug via ORAL
  Filled 2019-11-21 (×4): qty 1

## 2019-11-21 MED ORDER — PNEUMOCOCCAL VAC POLYVALENT 25 MCG/0.5ML IJ INJ
0.5000 mL | INJECTION | INTRAMUSCULAR | Status: AC
  Administered 2019-11-22: 0.5 mL via INTRAMUSCULAR
  Filled 2019-11-21: qty 0.5

## 2019-11-21 MED ORDER — ADULT MULTIVITAMIN W/MINERALS CH
1.0000 | ORAL_TABLET | Freq: Every day | ORAL | Status: DC
  Administered 2019-11-22 – 2019-11-25 (×4): 1 via ORAL
  Filled 2019-11-21 (×4): qty 1

## 2019-11-21 MED ORDER — SODIUM CHLORIDE 0.9 % IV BOLUS
1000.0000 mL | Freq: Once | INTRAVENOUS | Status: AC
  Administered 2019-11-21: 13:00:00 1000 mL via INTRAVENOUS

## 2019-11-21 NOTE — ED Notes (Signed)
ED TO INPATIENT HANDOFF REPORT  ED Nurse Name and Phone #: Jerrad Mendibles   S Name/Age/Gender Bruce Simpson 53 y.o. male Room/Bed: WA01/WA01  Code Status   Code Status: Prior  Home/SNF/Other Home Patient oriented to: self, place, time and situation Is this baseline? Yes   Triage Complete: Triage complete  Chief Complaint Hypotension [I95.9]  Triage Note Patient reports he was around someone with COVID-like symptoms about a week ago. Patient reports cough and congestion. Patient denies body aches or fatigue. Patient has a hx of COPD and EMS reports wheezing in the bottom lobes of lungs.   Patient c/o productive cough with white sputum and nasal congestion x 2 weeks. Patient states he is more SOB than usual. patient denies any fever.    Allergies No Known Allergies  Level of Care/Admitting Diagnosis ED Disposition    ED Disposition Condition Comment   Admit  Hospital Area: [facility] [[id number]]  Level of Care: Stepdown [14]  Admit to SDU based on following criteria: Severe physiological/psychological symptoms:  Any diagnosis requiring assessment & intervention at least every 4 hours on an ongoing basis to obtain desired patient outcomes including stability and rehabilitation  Covid Evaluation: Confirmed COVID Negative  Date Laboratory Confirmed COVID Negative: 11/21/2019  Diagnosis: Hypotension [[id number]]  Admitting Physician: Blain Pais [[id number]]  Attending Physician: Adele Barthel D [[id number]]  Estimated length of stay: 3 - 4 days  Certification:: I certify this patient will need inpatient services for at least 2 midnights       B Medical/Surgery History Past Medical History:  Diagnosis Date  . Depression   . ETOH abuse   . 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 Hypothyroidism   . Thyroid disease    "suppose to take RX; don't" (12/17/2017)   Past Surgical History:   Procedure Laterality Date  . NO PAST SURGERIES       A IV Location/Drains/Wounds Patient Lines/Drains/Airways Status   Active Line/Drains/Airways    Name:   Placement date:   Placement time:   Site:   Days:   Peripheral IV 11/21/19 Left;Distal Forearm   11/21/19    1302    Forearm   less than 1   Peripheral IV 11/21/19 Right Hand   11/21/19    1302    Hand   less than 1          Intake/Output Last 24 hours  Intake/Output Summary (Last 24 hours) at 11/21/2019 1909 Last data filed at 11/21/2019 1734 Gross per 24 hour  Intake 98.88 ml  Output --  Net 98.88 ml    Labs/Imaging Results for orders placed or performed during the hospital encounter of 11/21/19 (from the past 48 hour(s))  Comprehensive metabolic panel     Status: Abnormal   Collection Time: 11/21/19 12:59 PM  Result Value Ref Range   Sodium 130 (L) 135 - 145 mmol/L   Potassium 3.2 (L) 3.5 - 5.1 mmol/L   Chloride 94 (L) 98 - 111 mmol/L   CO2 24 22 - 32 mmol/L   Glucose, Bld 100 (H) 70 - 99 mg/dL   BUN 6 6 - 20 mg/dL   Creatinine, Ser 1.14 0.61 - 1.24 mg/dL   Calcium 8.8 (L) 8.9 - 10.3 mg/dL   Total Protein 7.0 6.5 - 8.1 g/dL   Albumin 3.5 3.5 - 5.0 g/dL   AST 61 (H) 15 - 41 U/L  ALT 33 0 - 44 U/L   Alkaline Phosphatase 128 (H) 38 - 126 U/L   Total Bilirubin 0.9 0.3 - 1.2 mg/dL   GFR calc non Af Amer >60 >60 mL/min   GFR calc Af Amer >60 >60 mL/min   Anion gap 12 5 - 15    Comment: Performed at [facility], [place] [address], [place], [place] [address]  CBC with Differential     Status: Abnormal   Collection Time: 11/21/19 12:59 PM  Result Value Ref Range   WBC 7.7 4.0 - 10.5 K/uL   RBC 3.92 (L) 4.22 - 5.81 MIL/uL   Hemoglobin 14.4 13.0 - 17.0 g/dL   HCT 41.0 39.0 - 52.0 %   MCV 104.6 (H) 80.0 - 100.0 fL   MCH 36.7 (H) 26.0 - 34.0 pg   MCHC 35.1 30.0 - 36.0 g/dL   RDW 16.3 (H) 11.5 - 15.5 %   Platelets 404 (H) 150 - 400 K/uL   nRBC 0.0 0.0 - 0.2 %   Neutrophils Relative % 58 %    Neutro Abs 4.6 1.7 - 7.7 K/uL   Lymphocytes Relative 31 %   Lymphs Abs 2.4 0.7 - 4.0 K/uL   Monocytes Relative 7 %   Monocytes Absolute 0.5 0.1 - 1.0 K/uL   Eosinophils Relative 2 %   Eosinophils Absolute 0.2 0.0 - 0.5 K/uL   Basophils Relative 1 %   Basophils Absolute 0.1 0.0 - 0.1 K/uL   Immature Granulocytes 1 %   Abs Immature Granulocytes 0.06 0.00 - 0.07 K/uL    Comment: Performed at [facility], [place] [address] [place], [place] [address]  TSH     Status: Abnormal   Collection Time: 11/21/19  1:04 PM  Result Value Ref Range   TSH 37.911 (H) 0.350 - 4.500 uIU/mL    Comment: Performed by a 3rd Generation assay with a functional sensitivity of <=0.01 uIU/mL. Performed at [facility], [place] [address], [place], [place] [address]   Culture, blood (routine x 2)     Status: None (Preliminary result)   Collection Time: 11/21/19  1:04 PM   Specimen: BLOOD  Result Value Ref Range   Specimen Description      BLOOD BLOOD LEFT FOREARM Performed at [facility] [address], [place], [place] [address]    Special Requests      BOTTLES DRAWN AEROBIC AND ANAEROBIC Blood Culture results may not be optimal due to an excessive volume of blood received in culture bottles Performed at [facility] [address] [place], [place] [address]    Culture PENDING    Report Status PENDING   Lactic acid, plasma     Status: None   Collection Time: 11/21/19  1:10 PM  Result Value Ref Range   Lactic Acid, Venous 1.8 0.5 - 1.9 mmol/L    Comment: Performed at [facility], [place] [address] [place], [place] [address]  Culture, blood (routine x 2)     Status: None (Preliminary result)   Collection Time: 11/21/19  1:12 PM   Specimen: BLOOD  Result Value Ref Range   Specimen Description      BLOOD BLOOD RIGHT HAND Performed at [facility] [address], [place], [place] [address]    Special  Requests      BOTTLES DRAWN AEROBIC AND ANAEROBIC Blood Culture adequate volume Performed at [facility], [place],  [place] [address]    Culture PENDING    Report Status PENDING   POC SARS Coronavirus 2 Ag-ED - Nasal Swab (BD Veritor Kit)     Status: None   Collection Time: 11/21/19  1:34 PM  Result Value Ref Range   SARS Coronavirus 2 Ag NEGATIVE NEGATIVE    Comment: (NOTE) SARS-CoV-2 antigen NOT DETECTED.  Negative results are presumptive.  Negative results do not preclude SARS-CoV-2 infection and should not be used as the sole basis for treatment or other patient management decisions, including infection  control decisions, particularly in the presence of clinical signs and  symptoms consistent with COVID-19, or in those who have been in contact with the virus.  Negative results must be combined with clinical observations, patient history, and epidemiological information. The expected result is Negative. Fact Sheet for Patients: PodPark.tn Fact Sheet for Healthcare Providers: GiftContent.is This test is not yet approved or cleared by the [place] FDA and  has been authorized for detection and/or diagnosis of SARS-CoV-2 by FDA under an Emergency Use Authorization (EUA).  This EUA will remain in effect (meaning this test can be used) for the duration of  the COVID-19 de claration under Section 564(b)(1) of the Act, 21 U.S.C. section 360bbb-3(b)(1), unless the authorization is terminated or revoked sooner.   CBG monitoring, ED     Status: None   Collection Time: 11/21/19  5:42 PM  Result Value Ref Range   Glucose-Capillary 89 70 - 99 mg/dL  Magnesium     Status: None   Collection Time: 11/21/19  5:48 PM  Result Value Ref Range   Magnesium 2.2 1.7 - 2.4 mg/dL    Comment: Performed at [facility], [place] [address] [place], [place] [address]  Phosphorus     Status: None    Collection Time: 11/21/19  5:48 PM  Result Value Ref Range   Phosphorus 2.8 2.5 - 4.6 mg/dL    Comment: Performed at [facility], [place] [address] [place], [place] [address]   DG Chest 2 View  Result Date: 11/21/2019 CLINICAL DATA:  Shortness of breath EXAM: CHEST - 2 VIEW COMPARISON:  01/01/2018 FINDINGS: The heart size and mediastinal contours are within normal limits. Probable emphysema. The visualized skeletal structures are unremarkable. IMPRESSION: Probable emphysema.  No acute abnormality of the lungs. Electronically Signed   By: Eddie Candle M.D.   On: 11/21/2019 13:39    Pending Labs Unresulted Labs (From admission, onward)    Start     Ordered   11/22/19 0500  Cortisol  Tomorrow morning,   R     11/21/19 1808   11/21/19 1810  Cortisol  Once,   STAT     11/21/19 1809   11/21/19 1810  T4, free  Once,   STAT     11/21/19 1809   11/21/19 1729  Urinalysis, Routine w reflex microscopic  Once,   STAT     11/21/19 1728   11/21/19 1729  Urine rapid drug screen (hosp performed)  ONCE - STAT,   STAT     11/21/19 1728   11/21/19 1449  SARS CORONAVIRUS 2 (TAT 6-24 HRS) Nasopharyngeal Nasopharyngeal Swab  (Tier 3 (TAT 6-24 hrs))  Once,   STAT    Question Answer Comment  Is this test for diagnosis or screening Diagnosis of ill patient   Symptomatic for COVID-19 as defined by CDC Yes   Date of Symptom Onset 11/07/2019   Hospitalized for COVID-19 Yes   Admitted  to ICU for COVID-19 No   Previously tested for COVID-19 Yes   Resident in a congregate (group) care setting No   Employed in healthcare setting No      11/21/19 1449   Signed and Held  HIV Antibody (routine testing w rflx)  (HIV Antibody (Routine testing w reflex) panel)  Once,   R     Signed and Held   Signed and Held  CBC  (enoxaparin (LOVENOX)    CrCl >/= 30 ml/min)  Once,   R    Comments: Baseline for enoxaparin therapy IF NOT ALREADY DRAWN.  Notify MD if PLT < 100 K.    Signed and Held   Signed and  Held  Creatinine, serum  (enoxaparin (LOVENOX)    CrCl >/= 30 ml/min)  Once,   R    Comments: Baseline for enoxaparin therapy IF NOT ALREADY DRAWN.    Signed and Held   Signed and Held  Creatinine, serum  (enoxaparin (LOVENOX)    CrCl >/= 30 ml/min)  Weekly,   R    Comments: while on enoxaparin therapy    Signed and Held   Signed and Held  Comprehensive metabolic panel  Tomorrow morning,   R     Signed and Held   Signed and Held  CBC  Tomorrow morning,   R     Signed and Held          Vitals/Pain Today's Vitals   11/21/19 1700 11/21/19 1730 11/21/19 1800 11/21/19 1830  BP: 113/86 139/79 (!) 116/96 (!) 155/95  Pulse: 78 81 70 88  Resp: '15 14 14 '$ (!) 22  Temp:      TempSrc:      SpO2: 94% 98% 95% 95%  Weight:      Height:      PainSc:        Isolation Precautions No active isolations  Medications Medications  doxycycline (VIBRA-TABS) tablet 100 mg (has no administration in time range)  levothyroxine (SYNTHROID) tablet 100 mcg (has no administration in time range)  hydrocortisone sodium succinate (SOLU-CORTEF) 100 MG injection 100 mg (100 mg Intravenous Given 11/21/19 1848)  LORazepam (ATIVAN) tablet 1-4 mg (has no administration in time range)    Or  LORazepam (ATIVAN) injection 1-4 mg (has no administration in time range)  cefTRIAXone (ROCEPHIN) 1 g in sodium chloride 0.9 % 100 mL IVPB (has no administration in time range)  nicotine (NICODERM CQ - dosed in mg/24 hours) patch 21 mg (has no administration in time range)  sodium chloride 0.9 % bolus 1,000 mL (0 mLs Intravenous Stopped 11/21/19 1500)  cefTRIAXone (ROCEPHIN) 1 g in sodium chloride 0.9 % 100 mL IVPB (0 g Intravenous Stopped 11/21/19 1734)  potassium chloride SA (KLOR-CON) CR tablet 40 mEq (40 mEq Oral Given 11/21/19 1818)    Mobility walks with device Low fall risk   Focused Assessments Cardiac Assessment Handoff:    Lab Results  Component Value Date   CKTOTAL 985 (H) 12/28/2017   TROPONINI <0.03  12/28/2017   No results found for: DDIMER Does the Patient currently have chest pain? No  , Pulmonary Assessment Handoff:  Lung sounds:   O2 Device: Room Air        R Recommendations: See Admitting Provider Note  Report given to:   Additional Notes:

## 2019-11-21 NOTE — ED Triage Notes (Signed)
Patient reports he was around someone with COVID-like symptoms about a week ago. Patient reports cough and congestion. Patient denies body aches or fatigue. Patient has a hx of COPD and EMS reports wheezing in the bottom lobes of lungs.

## 2019-11-21 NOTE — H&P (Signed)
[facility] History and Physical  Bruce Simpson HCW:[id number] DOB: 1966-04-20 DOA: 11/21/2019  Referring physician: Dr. Eulis Foster, ED physician PCP: Synthia Innocent Audrea Muscat, NP   Chief Complaint: Shortness of breath, cough scantly productive of tan sputum  HPI: Bruce Simpson is a 53 y.o. male   Bruce Simpson has PMH of alcohol abuse, emphysema, tobacco use, hypothyroidism with previous myxedema coma, and medication noncompliance. He presented to the ED today with complaints of shortness of breath and cough scantly productive of tan sputum x 1 week that were described as moderated in intensity and progressively worsened. He denies fever, body ache, or chills but has had fatigue. He was brought in by EMS and was noted to have wheezing en route. In the ED, his O2 sats were stable on RA but he was noted to have hypothermia on arrival with temp of 97.69F and hypotension with BP of 81/64. He was started on IV fluids bolus and his BP and temperature improved. His CXR was negative for consolidation though it showed emphysematous changes. His labwork was remarkable for TSH of 37.911, mild hyponatremia of 130, K of 3.2, Scr of 1.14, and Alk phos of 128, and AST of 61. His EKG showed prolonged QTc of 538 but no acute ST changes. COVID-19 test was negative.  Though his BP improved, it was thought that the had early presentation of myxedema and COPD exacerbation versus developing CAP. The hospitalist was called for his admission to the hospital.    Review of Systems:  Constitutional:  No weight loss, night sweats, Fevers, chills, + fatigue.  HEENT:  No headaches, Difficulty swallowing,Tooth/dental problems,Sore throat,  No sneezing, itching, ear ache, nasal congestion, post nasal drip,  Cardio-vascular:  No chest pain, Orthopnea, PND, swelling in lower extremities, anasarca, dizziness, palpitations  GI:  No heartburn, indigestion, abdominal pain, nausea, vomiting, diarrhea, change in bowel habits, loss  of appetite  Resp:  + shortness of breath with exertion or at rest. + productive cough. No coughing up of blood.+ wheezing.  Skin:  no rash or lesions.  GU:  no dysuria, change in color of urine, no urgency or frequency. No flank pain.  Musculoskeletal:  No joint pain or swelling. No decreased range of motion. No back pain.  Psych:  No change in mood or affect. No depression or anxiety. No memory loss.  Endocrine: heat and cold intolerance Heme: no easy bruising.   Past Medical History:  Diagnosis Date   Depression    ETOH abuse    Exposure to TB 1990s   "in contact w/someone w/TB; took the pill for 6 months; it's gone now" (12/17/2017)   Hypothyroidism    Thyroid disease    "suppose to take RX; don't" (12/17/2017)   Past Surgical History:  Procedure Laterality Date   NO PAST SURGERIES     Social History:  reports that he has been smoking cigarettes. He has a 34.00 pack-year smoking history. He has quit using smokeless tobacco.  His smokeless tobacco use included snuff. He reports current alcohol use. He reports current drug use. Drug: Marijuana.  No Known Allergies  Family History  Problem Relation Age of Onset   Heart failure Mother    Emphysema Father     Prior to Admission medications   Medication Sig Start Date End Date Taking? Authorizing Provider  albuterol (PROVENTIL HFA;VENTOLIN HFA) 108 (90 Base) MCG/ACT inhaler Inhale 2 puffs into the lungs every 6 (six) hours as needed for wheezing or shortness of breath. 01/02/18  Shelly Coss, MD  amoxicillin-clavulanate (AUGMENTIN) 875-125 MG tablet Take 1 tablet by mouth every 12 (twelve) hours. Patient not taking: Reported on 11/21/2019 01/02/18   Shelly Coss, MD  famotidine (PEPCID) 20 MG tablet Take 1 tablet (20 mg total) by mouth 2 (two) times daily. 01/02/18   Shelly Coss, MD  ferrous sulfate 325 (65 FE) MG tablet Take 1 tablet (325 mg total) by mouth daily with breakfast. 01/03/18   Shelly Coss, MD    folic acid (FOLVITE) 1 MG tablet Take 1 tablet (1 mg total) by mouth daily. 01/03/18   Shelly Coss, MD  guaiFENesin (MUCINEX) 600 MG 12 hr tablet Take 1 tablet (600 mg total) by mouth 2 (two) times daily. 01/05/18   Shelly Coss, MD  levothyroxine (SYNTHROID, LEVOTHROID) 100 MCG tablet Take 1 tablet (100 mcg total) by mouth daily before breakfast. 01/02/18   Shelly Coss, MD  nicotine (NICODERM CQ - DOSED IN MG/24 HOURS) 14 mg/24hr patch Place 1 patch (14 mg total) onto the skin daily. 12/23/17   Alma Friendly, MD  predniSONE (DELTASONE) 20 MG tablet Take 40 mg daily for 3 days then 20 mg daily for 3 days then 10 mg daily for 3 days then stop. Patient not taking: Reported on 11/21/2019 01/02/18   Shelly Coss, MD  thiamine 100 MG tablet Take 1 tablet (100 mg total) by mouth daily. 01/03/18   Shelly Coss, MD   Physical Exam: Vitals:   11/21/19 1500 11/21/19 1530 11/21/19 1600 11/21/19 1700  BP: 113/86 (!) 125/102 118/89 113/86  Pulse: 72 78 80 78  Resp: 13 16 (!) 9 15  Temp:      TempSrc:      SpO2: 96% 97% 96% 94%  Weight:      Height:        Wt Readings from Last 3 Encounters:  11/21/19 90.7 kg  02/05/18 90 kg  01/05/18 99.9 kg    General:  Appears calm and comfortable Eyes: PERRL, normal lids, irises & conjunctiva ENT: grossly normal hearing, lips & tongue Neck: normal ROM, supple Cardiovascular: RRR, No LE edema. Respiratory: Coarse breath sounds bilateral lower lobes with expiratory wheezing. Normal respiratory effort. Abdomen: soft, ntnd. Skin: no rash or induration seen on limited exam Musculoskeletal: grossly normal tone BUE/BLE Psychiatric: Appropriate mood and affect.  Neurologic: Alert and oriented x3, no hemiparesis, no facial droop.  GU: No cva tenderness, no suprapubic tenderness.          Labs on Admission:  Basic Metabolic Panel: Recent Labs  Lab 11/21/19 1259  NA 130*  K 3.2*  CL 94*  CO2 24  GLUCOSE 100*  BUN 6  CREATININE 1.14   CALCIUM 8.8*   Liver Function Tests: Recent Labs  Lab 11/21/19 1259  AST 61*  ALT 33  ALKPHOS 128*  BILITOT 0.9  PROT 7.0  ALBUMIN 3.5   No results for input(s): LIPASE, AMYLASE in the last 168 hours. No results for input(s): AMMONIA in the last 168 hours. CBC: Recent Labs  Lab 11/21/19 1259  WBC 7.7  NEUTROABS 4.6  HGB 14.4  HCT 41.0  MCV 104.6*  PLT 404*   Cardiac Enzymes: No results for input(s): CKTOTAL, CKMB, CKMBINDEX, TROPONINI in the last 168 hours.  BNP (last 3 results) No results for input(s): BNP in the last 8760 hours.  ProBNP (last 3 results) No results for input(s): PROBNP in the last 8760 hours.  CBG: No results for input(s): GLUCAP in the last 168 hours.  Radiological  Exams on Admission: DG Chest 2 View  Result Date: 11/21/2019 CLINICAL DATA:  Shortness of breath EXAM: CHEST - 2 VIEW COMPARISON:  01/01/2018 FINDINGS: The heart size and mediastinal contours are within normal limits. Probable emphysema. The visualized skeletal structures are unremarkable. IMPRESSION: Probable emphysema.  No acute abnormality of the lungs. Electronically Signed   By: Eddie Candle M.D.   On: 11/21/2019 13:39    EKG: Date/Time:                        'Sunday November 21 2019 13:03:47 EST Ventricular Rate:   86 PR Interval:                        QRS Duration:        112 QT Interval:                      438 QTC Calculation:    524 R Axis:                         96'$  Text Interpretation: Sinus rhythm Borderline intraventricular conduction delay Low voltage, extremity leads. Prolonged QT interval Since last tracing QT has lengthened.    Assessment/Plan Principal Problem:   Myxedema Active Problems:   Hypotension   Hypothermia   Hypokalemia   COPD with acute exacerbation (HCC)   Alcohol abuse   Hyponatremia   Tobacco use   Hypotension  Likely due to Myxedema.  Improving with IV fluids. Mentation appears at baseline.  He has no leukocytosis. Lactic acid is  wnl.  Check UA, follow up on blood cultures.  Continue Abx: Rocephin, doxycycline for atypical pna (azithromycin dcd due to prolonged Qtc) TSH of 37 on presentation. Will check T4, cortisol level.  Start synthroid 19mg daily and IV hydrocortisone.  Continue IV fluids.  Social worker consultation for medication assistance. Check UDS Patient at risk for hypoglycemia, will check CBG q4h, hypoglycemia protocol ordered Admit to Stepdown   COPD with exacerbation.  Had wheezing on presentation.  Not on albuterol inhaler at this time due to prolonged Qtc Start Incruse/Elipta  poct COVID-19 negative but being retested, on isolation precautions until 2nd result back  Hypothermia Resolved Likely due to myxedema.  No signs of sepsis at this time but continue monitoring.   Hyponatremia Mild, with sodium of 130.  Patient appears dehydrated. Will continue IV fluids and monitor.  Could be secondary to myxedema + alcohol  induced + intravascular vol depletion   Tobacco abuse Counseled on cessation Nicotine patch  Alcohol abuse Daily current amount is unknown but in the past reported drinking about 10 beers daily + Vodka AST 61, Alk phos 128. Nl tbili Counseled on cessation Start CIWA protocol. Pt reports no hx of withdrawal.  -check phosphorus level.  -2D echo ordered due to dyspnea and long-standing hx of alcohol abuse.   Hypokalemia.  Mild. Replete as needed. Check magnesium level.    Prolonged Qtc.  Of 538 on presentation.  Avoid Qtc prolonging meds.  Continue telemetry.  Check EKG in am.  Code Status: Full  DVT Prophylaxis: Lovenox Bruce Simpson Family Communication: None Disposition Plan: Admit as inpatient with anticipated at least 2MN stay with the risk of progression to severe respiratory failure and shock if not admitted as inpatient.   Time spent: 60 minutes  [facility]

## 2019-11-21 NOTE — ED Provider Notes (Addendum)
[facility]-EMERGENCY DEPT Provider Note   CSN: [id number] Arrival date & time: 11/21/19  1235     History Chief Complaint  Patient presents with  . Cough  . Nasal Congestion  . Hypotension  . Shortness of Breath    Bruce Simpson is a 53 y.o. male.  HPI Patient presents with cough with some mild sputum production.  Fatigue.  Shortness of breath.  States worse with lying down.  More fatigue.  Has not had fevers.  States he feels lightheaded also.  No chest pain.  History of thyroid disease and has been off his medicines for at least a year.  States has not been taking his other medicines either.  Hypotension upon arrival.  No hemoptysis.  Somewhat decreased appetite.  Patient's had no known Covid contacts.    Past Medical History:  Diagnosis Date  . Depression   . ETOH abuse   . 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 Hypothyroidism   . Thyroid disease    "suppose to take RX; don't" (12/17/2017)    Patient Active Problem List   Diagnosis Date Noted  . Aspiration pneumonia (HCC) 12/30/2017  . Macrocytic anemia 12/28/2017  . Hypokalemia 12/28/2017  . Hypothyroidism 12/28/2017  . Marijuana abuse 12/28/2017  . Acute hypoxemic respiratory failure (HCC) 12/28/2017  . Hypothermia   . Acute metabolic encephalopathy   . Hypotension 12/16/2017    Past Surgical History:  Procedure Laterality Date  . NO PAST SURGERIES         Family History  Problem Relation Age of Onset  . Heart failure Mother   . Emphysema Father     Social History   Tobacco Use  . Smoking status: Current Some Day Smoker    Packs/day: 1.00    Years: 34.00    Pack years: 34.00    Types: Cigarettes  . Smokeless tobacco: Former NeurosurgeonUser    Types: Snuff  Substance Use Topics  . Alcohol use: Yes  . Drug use: Yes    Types: Marijuana    Home Medications Prior to Admission medications   Medication Sig  Start Date End Date Taking? Authorizing Provider  albuterol (PROVENTIL HFA;VENTOLIN HFA) 108 (90 Base) MCG/ACT inhaler Inhale 2 puffs into the lungs every 6 (six) hours as needed for wheezing or shortness of breath. 01/02/18   Burnadette PopAdhikari, Amrit, MD  amoxicillin-clavulanate (AUGMENTIN) 875-125 MG tablet Take 1 tablet by mouth every 12 (twelve) hours. Patient not taking: Reported on 11/21/2019 01/02/18   Burnadette PopAdhikari, Amrit, MD  famotidine (PEPCID) 20 MG tablet Take 1 tablet (20 mg total) by mouth 2 (two) times daily. 01/02/18   Burnadette PopAdhikari, Amrit, MD  ferrous sulfate 325 (65 FE) MG tablet Take 1 tablet (325 mg total) by mouth daily with breakfast. 01/03/18   Burnadette PopAdhikari, Amrit, MD  folic acid (FOLVITE) 1 MG tablet Take 1 tablet (1 mg total) by mouth daily. 01/03/18   Burnadette PopAdhikari, Amrit, MD  guaiFENesin (MUCINEX) 600 MG 12 hr tablet Take 1 tablet (600 mg total) by mouth 2 (two) times daily. 01/05/18   Burnadette PopAdhikari, Amrit, MD  levothyroxine (SYNTHROID, LEVOTHROID) 100 MCG tablet Take 1 tablet (100 mcg total) by mouth daily before breakfast. 01/02/18   Burnadette PopAdhikari, Amrit, MD  nicotine (NICODERM CQ - DOSED IN MG/24 HOURS) 14 mg/24hr patch Place 1 patch (14 mg total) onto the skin daily. 12/23/17   Briant CedarEzenduka, Nkeiruka J, MD  predniSONE (DELTASONE)  20 MG tablet Take 40 mg daily for 3 days then 20 mg daily for 3 days then 10 mg daily for 3 days then stop. Patient not taking: Reported on 11/21/2019 01/02/18   Shelly Coss, MD  thiamine 100 MG tablet Take 1 tablet (100 mg total) by mouth daily. 01/03/18   Shelly Coss, MD    Allergies    Patient has no known allergies.  Review of Systems   Review of Systems  Constitutional: Positive for appetite change and fatigue.  HENT: Positive for congestion.   Respiratory: Positive for cough and shortness of breath.   Cardiovascular: Negative for chest pain.  Gastrointestinal: Negative for abdominal pain.  Genitourinary: Negative for flank pain.  Musculoskeletal: Negative for back pain.   Skin: Negative for rash.  Neurological: Negative for weakness.  Psychiatric/Behavioral: Negative for confusion.    Physical Exam Updated Vital Signs BP 127/85   Pulse 79   Temp 97.9 F (36.6 C) (Oral)   Resp (!) 21   Ht 6\' 2"  (1.88 m)   Wt 90.7 kg   SpO2 99%   BMI 25.68 kg/m   Physical Exam Vitals and nursing note reviewed.  HENT:     Head: Normocephalic.  Eyes:     Pupils: Pupils are equal, round, and reactive to light.  Cardiovascular:     Rate and Rhythm: Normal rate and regular rhythm.  Pulmonary:     Comments: Harsh breath sounds, particularly in bases and particularly on the right base. Chest:     Chest wall: No tenderness.  Musculoskeletal:     Right lower leg: No tenderness.     Left lower leg: No tenderness.  Skin:    General: Skin is warm.     Capillary Refill: Capillary refill takes less than 2 seconds.  Neurological:     Mental Status: He is alert.     Comments: Patient is awake and appropriate.     ED Results / Procedures / Treatments   Labs (all labs ordered are listed, but only abnormal results are displayed) Labs Reviewed  COMPREHENSIVE METABOLIC PANEL - Abnormal; Notable for the following components:      Result Value   Sodium 130 (*)    Potassium 3.2 (*)    Chloride 94 (*)    Glucose, Bld 100 (*)    Calcium 8.8 (*)    AST 61 (*)    Alkaline Phosphatase 128 (*)    All other components within normal limits  CBC WITH DIFFERENTIAL/PLATELET - Abnormal; Notable for the following components:   RBC 3.92 (*)    MCV 104.6 (*)    MCH 36.7 (*)    RDW 16.3 (*)    Platelets 404 (*)    All other components within normal limits  TSH - Abnormal; Notable for the following components:   TSH 37.911 (*)    All other components within normal limits  CULTURE, BLOOD (ROUTINE X 2)  CULTURE, BLOOD (ROUTINE X 2)  SARS CORONAVIRUS 2 (TAT 6-24 HRS)  LACTIC ACID, PLASMA  POC SARS CORONAVIRUS 2 AG -  ED    EKG None  Radiology DG Chest 2 View  Result  Date: 11/21/2019 CLINICAL DATA:  Shortness of breath EXAM: CHEST - 2 VIEW COMPARISON:  01/01/2018 FINDINGS: The heart size and mediastinal contours are within normal limits. Probable emphysema. The visualized skeletal structures are unremarkable. IMPRESSION: Probable emphysema.  No acute abnormality of the lungs. Electronically Signed   By: Eddie Candle M.D.   On: 11/21/2019  13:39    Procedures Procedures (including critical care time)  Medications Ordered in ED Medications  cefTRIAXone (ROCEPHIN) 1 g in sodium chloride 0.9 % 100 mL IVPB (has no administration in time range)  azithromycin (ZITHROMAX) 500 mg in sodium chloride 0.9 % 250 mL IVPB (has no administration in time range)  sodium chloride 0.9 % bolus 1,000 mL (1,000 mLs Intravenous New Bag/Given 11/21/19 1310)    ED Course  I have reviewed the triage vital signs and the nursing notes.  Pertinent labs & imaging results that were available during my care of the patient were reviewed by me and considered in my medical decision making (see chart for details).    MDM Rules/Calculators/A&P                       Patient presented weakness shortness of breath cough some mild sputum production.  Initially hypotensive with pressure in the 80s.  Blood pressure improved to 120 after fluid bolus.  Not febrile.  Did have localizing lung findings with crackles at right base.  Clinically this is pneumonia and will be treated with antibiotics.  However with the hypotension and medication noncompliance feels there could be a component of the hypotension coming from hypothyroidism.  Has had severe hypothyroidism in the past.  Patient is homeless and does not have a way to get his medicines now.  I feel with the hypotension and clinical pneumonia patient benefit from admission the hospital for least IV antibiotics and get started back on his medications and while he is inpatient can figure out how to get him more resources.  Lactic acid normal.  I  feels the hypotension is more related to thyroid and potential dehydration as opposed to severe sepsis Final Clinical Impression(s) / ED Diagnoses Final diagnoses:  Community acquired pneumonia of right lower lobe of lung  Hypothyroidism, unspecified type  Homeless  Noncompliance with medications    Rx / DC Orders ED Discharge Orders    None       Benjiman Core, MD 11/21/19 1451    Benjiman Core, MD 11/21/19 1455

## 2019-11-21 NOTE — ED Triage Notes (Signed)
Patient c/o productive cough with white sputum and nasal congestion x 2 weeks. Patient states he is more SOB than usual. patient denies any fever.

## 2019-11-21 NOTE — ED Notes (Signed)
Called lab and cancelled the rapid per Dr. Eulis Foster. Surgery does not want to operate until tomorrow. Notified AC as well

## 2019-11-21 NOTE — ED Provider Notes (Signed)
3:20 PM-checkout from Dr. Alvino Chapel to arrange admission, for severe hypothyroidism associated with hypotension, cough, emphysema, homelessness.  EKG indicates prolonged QT from baseline. Patient has apparent dehydration associated with minor metabolic abnormalities, and clinical examination consistent with volume depletion.   EKG Interpretation  Date/Time:  Sunday November 21 2019 13:03:47 EST Ventricular Rate:  86 PR Interval:    QRS Duration: 112 QT Interval:  438 QTC Calculation: 524 R Axis:   96 Text Interpretation: Sinus rhythm Borderline intraventricular conduction delay Low voltage, extremity leads Prolonged QT interval Since last tracing QT has lengthened Confirmed by Daleen Bo [id number] on 11/21/2019 3:32:54 PM      Wt Readings from Last 3 Encounters:  11/21/19 90.7 kg  02/05/18 90 kg  01/05/18 99.9 kg   Temp Readings from Last 3 Encounters:  11/21/19 97.9 F (36.6 C) (Oral)  02/05/18 97.9 F (36.6 C) (Oral)  01/05/18 97.6 F (36.4 C) (Oral)   BP Readings from Last 3 Encounters:  11/21/19 113/86  02/05/18 111/73  01/05/18 128/73   Pulse Readings from Last 3 Encounters:  11/21/19 72  02/05/18 88  01/05/18 70    Clinical Course as of Nov 21 1535  Sun Nov 21, 2019  1533 Pda, no infiltrate or CHF, interpreted by me  DG Chest 2 View [EW]  1534 Normal  CBC with Differential(!) [EW]  1534 Normal except sodium low, potassium low, chloride low, glucose high, calcium low, AST high, alkaline phosphatase high  Comprehensive metabolic panel(!) [EW]  3014 Markedly elevated  TSH(!) [EW]  1535 Normal  POC SARS Coronavirus 2 Ag-ED - Nasal Swab (BD Veritor Kit) [EW]    Clinical Course User Index [EW] Daleen Bo, MD     3:15 PM-Consult complete with hospitalist. Patient case explained and discussed. She agrees to admit patient for further evaluation and treatment. Call ended at 4:38 PM    Daleen Bo, MD 11/21/19 [phone]

## 2019-11-21 NOTE — ED Notes (Signed)
Repeat POC cbg 101

## 2019-11-21 NOTE — ED Notes (Signed)
pts food tray given, will bring pt to floor after he has eaten

## 2019-11-21 NOTE — ED Notes (Signed)
Awaiting pharmacy verification of meds for distribution

## 2019-11-21 NOTE — ED Notes (Signed)
POCT cbg 89 pt given cup of orange juice will recheck in 15 min

## 2019-11-22 ENCOUNTER — Inpatient Hospital Stay (HOSPITAL_COMMUNITY): Payer: Self-pay

## 2019-11-22 DIAGNOSIS — Z720 Tobacco use: Secondary | ICD-10-CM

## 2019-11-22 DIAGNOSIS — R0602 Shortness of breath: Secondary | ICD-10-CM

## 2019-11-22 DIAGNOSIS — E871 Hypo-osmolality and hyponatremia: Secondary | ICD-10-CM

## 2019-11-22 LAB — COMPREHENSIVE METABOLIC PANEL
ALT: 27 U/L (ref 0–44)
AST: 49 U/L — ABNORMAL HIGH (ref 15–41)
Albumin: 3.3 g/dL — ABNORMAL LOW (ref 3.5–5.0)
Alkaline Phosphatase: 108 U/L (ref 38–126)
Anion gap: 10 (ref 5–15)
BUN: 9 mg/dL (ref 6–20)
CO2: 23 mmol/L (ref 22–32)
Calcium: 8.6 mg/dL — ABNORMAL LOW (ref 8.9–10.3)
Chloride: 98 mmol/L (ref 98–111)
Creatinine, Ser: 1.08 mg/dL (ref 0.61–1.24)
GFR calc Af Amer: >60 mL/min (ref >60)
GFR calc non Af Amer: >60 mL/min (ref >60)
Glucose, Bld: 167 mg/dL — ABNORMAL HIGH (ref 70–99)
Potassium: 3.6 mmol/L (ref 3.5–5.1)
Sodium: 131 mmol/L — ABNORMAL LOW (ref 135–145)
Total Bilirubin: 0.8 mg/dL (ref 0.3–1.2)
Total Protein: 6.4 g/dL — ABNORMAL LOW (ref 6.5–8.1)

## 2019-11-22 LAB — URINALYSIS, ROUTINE W REFLEX MICROSCOPIC
Bilirubin Urine: NEGATIVE
Glucose, UA: NEGATIVE mg/dL
Hgb urine dipstick: NEGATIVE
Ketones, ur: 20 mg/dL — AB
Nitrite: NEGATIVE
Protein, ur: NEGATIVE mg/dL
Specific Gravity, Urine: 1.017 (ref 1.005–1.030)
pH: 6 (ref 5.0–8.0)

## 2019-11-22 LAB — GLUCOSE, CAPILLARY
Glucose-Capillary: 169 mg/dL — ABNORMAL HIGH (ref 70–99)
Glucose-Capillary: 129 mg/dL — ABNORMAL HIGH (ref 70–99)
Glucose-Capillary: 153 mg/dL — ABNORMAL HIGH (ref 70–99)
Glucose-Capillary: 159 mg/dL — ABNORMAL HIGH (ref 70–99)
Glucose-Capillary: 151 mg/dL — ABNORMAL HIGH (ref 70–99)

## 2019-11-22 LAB — CBC
HCT: 35.1 % — ABNORMAL LOW (ref 39.0–52.0)
Hemoglobin: 12.2 g/dL — ABNORMAL LOW (ref 13.0–17.0)
MCH: 36.7 pg — ABNORMAL HIGH (ref 26.0–34.0)
MCHC: 34.8 g/dL (ref 30.0–36.0)
MCV: 105.7 fL — ABNORMAL HIGH (ref 80.0–100.0)
Platelets: 334 10*3/uL (ref 150–400)
RBC: 3.32 MIL/uL — ABNORMAL LOW (ref 4.22–5.81)
RDW: 16.3 % — ABNORMAL HIGH (ref 11.5–15.5)
WBC: 9.1 10*3/uL (ref 4.0–10.5)
nRBC: 0 % (ref 0.0–0.2)

## 2019-11-22 LAB — CORTISOL
Cortisol, Plasma: 84.9 ug/dL
Cortisol, Plasma: >100 ug/dL

## 2019-11-22 LAB — HIV ANTIBODY (ROUTINE TESTING W REFLEX): HIV Screen 4th Generation wRfx: NONREACTIVE

## 2019-11-22 LAB — ECHOCARDIOGRAM COMPLETE
Height: 74 in
Weight: 3513.25 oz

## 2019-11-22 LAB — RAPID URINE DRUG SCREEN, HOSP PERFORMED
Amphetamines: NOT DETECTED
Barbiturates: NOT DETECTED
Benzodiazepines: NOT DETECTED
Cocaine: NOT DETECTED
Opiates: NOT DETECTED
Tetrahydrocannabinol: POSITIVE — AB

## 2019-11-22 LAB — MRSA PCR SCREENING: MRSA by PCR: NEGATIVE

## 2019-11-22 LAB — SARS CORONAVIRUS 2 (TAT 6-24 HRS): SARS Coronavirus 2: NEGATIVE

## 2019-11-22 LAB — T4, FREE: Free T4: 0.24 ng/dL — ABNORMAL LOW (ref 0.61–1.12)

## 2019-11-22 MED ORDER — ALUM & MAG HYDROXIDE-SIMETH 200-200-20 MG/5ML PO SUSP
30.0000 mL | Freq: Four times a day (QID) | ORAL | Status: DC | PRN
  Administered 2019-11-22: 20:00:00 30 mL via ORAL
  Filled 2019-11-22: qty 30

## 2019-11-22 MED ORDER — CHLORHEXIDINE GLUCONATE CLOTH 2 % EX PADS
6.0000 | MEDICATED_PAD | Freq: Every day | CUTANEOUS | Status: DC
  Administered 2019-11-22: 16:00:00 6 via TOPICAL

## 2019-11-22 MED ORDER — ENSURE ENLIVE PO LIQD
237.0000 mL | ORAL | Status: DC
  Administered 2019-11-22 – 2019-11-23 (×2): 237 mL via ORAL

## 2019-11-22 MED ORDER — ORAL CARE MOUTH RINSE
15.0000 mL | Freq: Two times a day (BID) | OROMUCOSAL | Status: DC
  Administered 2019-11-22 – 2019-11-25 (×6): 15 mL via OROMUCOSAL

## 2019-11-22 NOTE — Progress Notes (Signed)
PROGRESS NOTE  Bruce Simpson ZOX:[id number] DOB: 1966/05/07 DOA: 11/21/2019 PCP: Lavinia Sharps, NP  HPI/Recap of past 24 hours: HPI from Dr Garald Braver Bruce Simpson has PMH of alcohol abuse, emphysema, tobacco use, hypothyroidism with previous myxedema coma, and medication noncompliance, presents to the ED with complaints of shortness of breath and cough scantly productive of tan sputum x 1 week. In the ED, his O2 sats were stable on RA but he was noted to have hypothermia on arrival with temp of 97.62F and hypotension with BP of 81/64. He was started on IV fluids bolus and his BP and temperature improved. CXR was negative for consolidation though it showed emphysematous changes. Labwork was remarkable for TSH of 37.911, mild hyponatremia of 130, K of 3.2, Scr of 1.14. EKG showed prolonged QTc of 538 but no acute ST changes. COVID-19 test was negative. Pt admitted for further management.    Today, pt reports generalized weakness, denies any chest pain, abdominal pain, nausea/vomiting, fever/chills, shortness of breath.   Assessment/Plan: Principal Problem:   Myxedema Active Problems:   Hypotension   Hypothermia   Hypokalemia   COPD with acute exacerbation (HCC)   Alcohol abuse   Hyponatremia   Tobacco use  Possible Myxedema Hypotension, hypothermia on presentation Currently BP and temperature normalized Currently afebrile, with no leukocytosis TSH 37 on presentation, free T4 0.24 BC x2 pending ECHO with EF of 55-60% Status post IV fluids Continue Synthroid, IV hydrocortisone Monitor closely in stepdown unit  Likely acute exacerbation of COPD Chest x-ray Continue Rocephin, doxycycline Continue inhalers  Hyponatremia Likely 2/2 myxedema, alcohol abuse + poor oral intake  Prolonged QTC Monitor EKG Avoid QTc prolonging meds Telemetry  Alcohol/tobacco abuse Advised to quit CIWA protocol Nicotine patch           Malnutrition Type:      Malnutrition  Characteristics:      Nutrition Interventions:       Estimated body mass index is 28.19 kg/m as calculated from the following:   Height as of this encounter:  (1.88 m).   Weight as of this encounter: 99.6 kg.     Code Status: Full  Family Communication: None at bedside  Disposition Plan: Likely home   Consultants:  None  Procedures:  None  Antimicrobials:  Rocephin  Azithromycin  DVT prophylaxis: lovenox   Objective: Vitals:   11/22/19 1000 11/22/19 1100 11/22/19 1200 11/22/19 1300  BP: 126/89 122/89 (!) 116/93 126/87  Pulse: 97 96 92 86  Resp: 13 (!) Temp:   98 F (36.7 C)   TempSrc:   Oral   SpO2: 94% 92% 95% 91%  Weight:      Height:        Intake/Output Summary (Last 24 hours) at 11/22/2019 1416 Last data filed at 11/22/2019 1200 Gross per 24 hour  Intake 1578.88 ml  Output 300 ml  Net 1278.88 ml   Filed Weights   11/21/19 1250 11/21/19 2100  Weight: 90.7 kg 99.6 kg    Exam:  General: NAD   Cardiovascular: S1, S2 present  Respiratory: Coarse BS at the bases  Abdomen: Soft, nontender, nondistended, bowel sounds present  Musculoskeletal: No bilateral pedal edema noted  Skin: Normal  Psychiatry: Normal mood   Data Reviewed: CBC: Recent Labs  Lab 11/21/19 1259 11/21/19 2047 11/22/19 0209  WBC 7.7 8.8 9.1  NEUTROABS 4.6  --   --   HGB 14.4 12.4* 12.2*  HCT 41.0 35.8* 35.1*  MCV  104.6* 105.9* 105.7*  PLT 404* 321 334   Basic Metabolic Panel: Recent Labs  Lab 11/21/19 1259 11/21/19 1748 11/21/19 2047 11/22/19 0209  NA 130*  --   --  131*  K 3.2*  --   --  3.6  CL 94*  --   --  98  CO2 24  --   --  23  GLUCOSE 100*  --   --  167*  BUN 6  --   --  9  CREATININE 1.14  --  1.04 1.08  CALCIUM 8.8*  --   --  8.6*  MG  --  2.2  --   --   PHOS  --  2.8  --   --    GFR: Estimated Creatinine Clearance: 102.1 mL/min (by C-G formula based on SCr of 1.08 mg/dL). Liver Function Tests: Recent Labs    Lab 11/21/19 1259 11/22/19 0209  AST 61* 49*  ALT 33 27  ALKPHOS 128* 108  BILITOT 0.9 0.8  PROT 7.0 6.4*  ALBUMIN 3.5 3.3*   No results for input(s): LIPASE, AMYLASE in the last 168 hours. No results for input(s): AMMONIA in the last 168 hours. Coagulation Profile: No results for input(s): INR, PROTIME in the last 168 hours. Cardiac Enzymes: No results for input(s): CKTOTAL, CKMB, CKMBINDEX, TROPONINI in the last 168 hours. BNP (last 3 results) No results for input(s): PROBNP in the last 8760 hours. HbA1C: No results for input(s): HGBA1C in the last 72 hours. CBG: Recent Labs  Lab 11/21/19 1742 11/21/19 1817 11/22/19 0752 11/22/19 1119  GLUCAP 89 101* 169* 129*   Lipid Profile: No results for input(s): CHOL, HDL, LDLCALC, TRIG, CHOLHDL, LDLDIRECT in the last 72 hours. Thyroid Function Tests: Recent Labs    11/21/19 1304 11/21/19 2047  TSH 37.911*  --   FREET4  --  0.24*   Anemia Panel: No results for input(s): VITAMINB12, FOLATE, FERRITIN, TIBC, IRON, RETICCTPCT in the last 72 hours. Urine analysis:    Component Value Date/Time   COLORURINE YELLOW 11/22/2019 0400   APPEARANCEUR CLEAR 11/22/2019 0400   LABSPEC 1.017 11/22/2019 0400   PHURINE 6.0 11/22/2019 0400   GLUCOSEU NEGATIVE 11/22/2019 0400   HGBUR NEGATIVE 11/22/2019 0400   BILIRUBINUR NEGATIVE 11/22/2019 0400   KETONESUR 20 (A) 11/22/2019 0400   PROTEINUR NEGATIVE 11/22/2019 0400   NITRITE NEGATIVE 11/22/2019 0400   LEUKOCYTESUR TRACE (A) 11/22/2019 0400   Sepsis Labs: (procalcitonin:4,lacticidven:4)  ) Recent Results (from the past 240 hour(s))  Culture, blood (routine x 2)     Status: None (Preliminary result)   Collection Time: 11/21/19  1:04 PM   Specimen: BLOOD  Result Value Ref Range Status   Specimen Description   Final    BLOOD BLOOD LEFT FOREARM Performed at [facility], [address] [place], [place] [address]    Special Requests   Final     BOTTLES DRAWN AEROBIC AND ANAEROBIC Blood Culture results may not be optimal due to an excessive volume of blood received in culture bottles   Culture   Final    NO GROWTH < 24 HOURS Performed at [facility], [address], [place], [place] [address]    Report Status PENDING  Incomplete  Culture, blood (routine x 2)     Status: None (Preliminary result)   Collection Time: 11/21/19  1:12 PM   Specimen: BLOOD  Result Value Ref Range Status   Specimen Description   Final    BLOOD  BLOOD RIGHT HAND Performed at [facility], [place] [address], [place], [place] [address]    Special Requests   Final    BOTTLES DRAWN AEROBIC AND ANAEROBIC Blood Culture adequate volume   Culture   Final    NO GROWTH < 24 HOURS Performed at [facility], [place] [address], [place], [place] [address]    Report Status PENDING  Incomplete  SARS CORONAVIRUS 2 (TAT 6-24 HRS) Nasopharyngeal Nasopharyngeal Swab     Status: None   Collection Time: 11/21/19  2:49 PM   Specimen: Nasopharyngeal Swab  Result Value Ref Range Status   SARS Coronavirus 2 NEGATIVE NEGATIVE Final    Comment: (NOTE) SARS-CoV-2 target nucleic acids are NOT DETECTED. The SARS-CoV-2 RNA is generally detectable in upper and lower respiratory specimens during the acute phase of infection. Negative results do not preclude SARS-CoV-2 infection, do not rule out co-infections with other pathogens, and should not be used as the sole basis for treatment or other patient management decisions. Negative results must be combined with clinical observations, patient history, and epidemiological information. The expected result is Negative. Fact Sheet for Patients: SugarRoll.be Fact Sheet for Healthcare Providers: https://www.woods-mathews.com/ This test is not yet approved or cleared by the [place] FDA and  has been authorized for detection and/or diagnosis of SARS-CoV-2 by FDA  under an Emergency Use Authorization (EUA). This EUA will remain  in effect (meaning this test can be used) for the duration of the COVID-19 declaration under Section 56 4(b)(1) of the Act, 21 U.S.C. section 360bbb-3(b)(1), unless the authorization is terminated or revoked sooner. Performed at [facility], [place] [address], [place], [place] [address]       Studies: ECHOCARDIOGRAM COMPLETE  Result Date: 11/22/2019   ECHOCARDIOGRAM REPORT   Patient Name:   JOVANI FLURY [facility] Date of Exam: 11/22/2019 Medical Rec #:  [id number]         Height:       74.0 in Accession #:    [id number]        Weight:       219.6 lb Date of Birth:  1966-01-08         BSA:          2.26 m Patient Age:    53 years          BP:           127/90 mmHg Patient Gender: M                 HR:           92 bpm. Exam Location:  Inpatient Procedure: 2D Echo, Color Doppler and Cardiac Doppler Indications:    R06.9 DOE  History:        Patient has prior history of Echocardiogram examinations, most                 recent 12/20/2017. COPD; Risk Factors:Current Smoker.  Sonographer:    Raquel Sarna Senior RDCS Referring Phys: [id number] [place]  1. Left ventricular ejection fraction, by visual estimation, is 55 to 60%. The left ventricle has normal function. There is no left ventricular hypertrophy.  2. The left ventricle demonstrates regional wall motion abnormalities.  3. No change from previous echo in 2019.  4. Global right ventricle has normal systolic function.The right ventricular size is normal. No increase in right ventricular wall thickness.  5. Left atrial size was normal.  6. Right atrial size was normal.  7. The mitral valve is normal in structure. Trivial mitral valve regurgitation.  8. The tricuspid valve is normal in structure. Tricuspid valve regurgitation is trivial.  9. The aortic valve is normal in structure. Aortic valve regurgitation is trivial. 10. The pulmonic valve was normal in structure. Pulmonic  valve regurgitation is not visualized. 11. Normal pulmonary artery systolic pressure. FINDINGS  Left Ventricle: Left ventricular ejection fraction, by visual estimation, is 55 to 60%. The left ventricle has normal function. The left ventricle demonstrates regional wall motion abnormalities. The left ventricular internal cavity size was the left ventricle is normal in size. There is no left ventricular hypertrophy. No change from previous echo in 2019. Right Ventricle: The right ventricular size is normal. No increase in right ventricular wall thickness. Global RV systolic function is has normal systolic function. The tricuspid regurgitant velocity is 1.76 m/s, and with an assumed right atrial pressure  of 3 mmHg, the estimated right ventricular systolic pressure is normal at 15.4 mmHg. Left Atrium: Left atrial size was normal in size. Right Atrium: Right atrial size was normal in size Pericardium: There is no evidence of pericardial effusion. Mitral Valve: The mitral valve is normal in structure. Trivial mitral valve regurgitation. Tricuspid Valve: The tricuspid valve is normal in structure. Tricuspid valve regurgitation is trivial. Aortic Valve: The aortic valve is normal in structure. Aortic valve regurgitation is trivial. Pulmonic Valve: The pulmonic valve was normal in structure. Pulmonic valve regurgitation is not visualized. Pulmonic regurgitation is not visualized. Aorta: The aortic root is normal in size and structure. IAS/Shunts: No atrial level shunt detected by color flow Doppler.  LEFT VENTRICLE PLAX 2D LVIDd:         4.70 cm LVIDs:         3.90 cm LV PW:         1.10 cm LV IVS:        1.00 cm LVOT diam:     2.00 cm LV SV:         36 ml LV SV Index:   15.87 LVOT Area:     3.14 cm  LV Volumes (MOD) LV area d, A2C:    28.20 cm LV area d, A4C:    28.90 cm LV area s, A2C:    17.40 cm LV area s, A4C:    15.00 cm LV major d, A2C:   8.48 cm LV major d, A4C:   8.31 cm LV major s, A2C:   7.02 cm LV major s,  A4C:   6.44 cm LV vol d, MOD A2C: 79.1 ml LV vol d, MOD A4C: 83.2 ml LV vol s, MOD A2C: 36.4 ml LV vol s, MOD A4C: 28.9 ml LV SV MOD A2C:     42.7 ml LV SV MOD A4C:     83.2 ml LV SV MOD BP:      48.1 ml RIGHT VENTRICLE RV S prime:     12.80 cm/s TAPSE (M-mode): 2.5 cm LEFT ATRIUM             Index       RIGHT ATRIUM           Index LA diam:        4.00 cm 1.77 cm/m  RA Area:     14.80 cm LA Vol (A2C):   28.6 ml 12.65 ml/m RA Volume:   38.20 ml  16.89 ml/m LA Vol (A4C):   35.5 ml 15.70 ml/m LA Biplane Vol: 35.0 ml 15.48 ml/m  AORTIC  VALVE LVOT Vmax:   92.70 cm/s LVOT Vmean:  72.700 cm/s LVOT VTI:    0.155 m  AORTA Ao Root diam: 3.40 cm Ao Asc diam:  3.70 cm TRICUSPID VALVE TR Peak grad:   12.4 mmHg TR Vmax:        176.00 cm/s  SHUNTS Systemic VTI:  0.16 m Systemic Diam: 2.00 cm  Dietrich PatesPaula Ross MD Electronically signed by Dietrich PatesPaula Ross MD Signature Date/Time: 11/22/2019/11:51:51 AM    Final     Scheduled Meds: . Chlorhexidine Gluconate Cloth  6 each Topical Daily  . docusate sodium  100 mg Oral BID  . doxycycline  100 mg Oral Q12H  . enoxaparin (LOVENOX) injection  40 mg Subcutaneous Q24H  . folic acid  1 mg Oral Daily  . guaiFENesin  600 mg Oral BID  . hydrocortisone sod succinate (SOLU-CORTEF) inj  100 mg Intravenous Q8H  . levothyroxine  100 mcg Oral Q0600  . multivitamin with minerals  1 tablet Oral Daily  . nicotine  21 mg Transdermal Daily  . sodium chloride flush  3 mL Intravenous Q12H  . thiamine  100 mg Oral Daily  . umeclidinium bromide  1 puff Inhalation Daily    Continuous Infusions: . cefTRIAXone (ROCEPHIN)  IV       LOS: 1 day     Briant CedarNkeiruka J Chai Verdejo, MD [facility]  If 7PM-7AM, please contact night-coverage [email or web address] 11/22/2019, 2:16 PM

## 2019-11-22 NOTE — Evaluation (Signed)
Physical Therapy Evaluation Patient Details Name: Bruce Simpson MRN: [id number] DOB: [birth year] Today's Date: 11/22/2019   History of Present Illness  53 yo male admitted with myxedema, hypotension. Hx of ETOH abuse, COPD, myxedema coma, hypothyroidism, noncompliance. Pt is homeless.  Clinical Impression  On eval, pt was Min guard assist for mobility. He walked ~250 feer around the unit. Mildly unsteady at times. Dyspnea 2/4, O2 87% on RA during ambulation. Anticipate pt will continue to progress well. Will follow during hospital stay.     Follow Up Recommendations No PT follow up    Equipment Recommendations  None recommended by PT    Recommendations for Other Services       Precautions / Restrictions Precautions Precautions: Fall Restrictions Weight Bearing Restrictions: No      Mobility  Bed Mobility Overal bed mobility: Modified Independent                Transfers Overall transfer level: Modified independent                  Ambulation/Gait Ambulation/Gait assistance: Min guard Gait Distance (Feet): 250 Feet Assistive device: None Gait Pattern/deviations: Step-through pattern;Decreased stride length;Drifts right/left     General Gait Details: mildly unstead at times. O2 down to 87% on RA, dyspnea 2/4 with ambulation  Stairs            Wheelchair Mobility    Modified Rankin (Stroke Patients Only)       Balance Overall balance assessment: Needs assistance;Mild deficits observed, not formally tested                                           Pertinent Vitals/Pain Pain Assessment: 0-10 Pain Score: 5  Pain Location: bil LEs Pain Descriptors / Indicators: Discomfort;Sore;Tightness Pain Intervention(s): Monitored during session    Home Living Family/patient expects to be discharged to:: Shelter/Homeless                      Prior Function Level of Independence: Independent with assistive device(s)          Comments: has a walking stick/staff     Hand Dominance        Extremity/Trunk Assessment   Upper Extremity Assessment Upper Extremity Assessment: Overall WFL for tasks assessed    Lower Extremity Assessment Lower Extremity Assessment: Overall WFL for tasks assessed    Cervical / Trunk Assessment Cervical / Trunk Assessment: Normal  Communication   Communication: No difficulties  Cognition Arousal/Alertness: Awake/alert Behavior During Therapy: WFL for tasks assessed/performed Overall Cognitive Status: Within Functional Limits for tasks assessed                                        General Comments      Exercises     Assessment/Plan    PT Assessment Patient needs continued PT services  PT Problem List Decreased activity tolerance;Decreased mobility;Decreased balance       PT Treatment Interventions DME instruction;Gait training;Therapeutic activities;Therapeutic exercise;Functional mobility training;Balance training;Patient/family education    PT Goals (Current goals can be found in the Care Plan section)  Acute Rehab PT Goals Patient Stated Goal: regain PLOF/independence PT Goal Formulation: With patient Time For Goal Achievement: 12/06/19 Potential to Achieve Goals: Good    Frequency Min  3X/week   Barriers to discharge        Co-evaluation               AM-PAC PT "6 Clicks" Mobility  Outcome Measure Help needed turning from your back to your side while in a flat bed without using bedrails?: None Help needed moving from lying on your back to sitting on the side of a flat bed without using bedrails?: None Help needed moving to and from a bed to a chair (including a wheelchair)?: None Help needed standing up from a chair using your arms (e.g., wheelchair or bedside chair)?: None Help needed to walk in hospital room?: A Little Help needed climbing 3-5 steps with a railing? : A Little 6 Click Score: 22    End of Session    Activity Tolerance: Patient tolerated treatment well Patient left: in bed;with call bell/phone within reach   PT Visit Diagnosis: Unsteadiness on feet (R26.81)    Time: 6759-1638 PT Time Calculation (min) (ACUTE ONLY): 14 min   Charges:   PT Evaluation $PT Eval Moderate Complexity: [place], PT Acute Rehabilitation Services Pager: [phone] Office: [phone]

## 2019-11-22 NOTE — Progress Notes (Signed)
Echocardiogram 2D Echocardiogram has been performed.  Bruce Simpson Bruce Simpson 11/22/2019, 9:07 AM

## 2019-11-22 NOTE — Progress Notes (Signed)
Initial Nutrition Assessment  INTERVENTION:   -Ensure Enlive po daily, each supplement provides 350 kcal and 20 grams of protein -Placed order for snack today for patient  NUTRITION DIAGNOSIS:   Increased nutrient needs related to chronic illness(COPD) as evidenced by estimated needs.  GOAL:   Patient will meet greater than or equal to 90% of their needs  MONITOR:   PO intake, Supplement acceptance, Labs, Weight trends, I & O's  REASON FOR ASSESSMENT:   Consult COPD Protocol  ASSESSMENT:   53 y.o. male patient [place] of alcohol abuse, emphysema, tobacco use, hypothyroidism with previous myxedema coma, and medication noncompliance. He presented to the ED today with complaints of shortness of breath and cough scantly productive of tan sputum x 1 week that were described as moderated in intensity and progressively worsened.  Patient reports over the past week he has not been eating that well. States prior to developing his symptoms (SOB, cough), he was "eating like a horse". Pt states he feels that his appetite is starting to come back. He ate most of his breakfast and he ate 1/2 sandwich for lunch. Requested ice cream and a cola for a snack. RD ordered for pt. Pt is also interested in a protein shake, will order Ensure supplement daily for additional kcals and protein.  Pt reports no weight loss. Per weight records, no weight loss noted.   I/Os: +1.2L since admit UOP: 300 ml x 24 hrs  Medications: Folic acid tablet, Multivitamin with minerals daily, Thiamine tablet Labs reviewed: CBGs: 129-169 Low Na  NUTRITION - FOCUSED PHYSICAL EXAM:  Working remotely.  Diet Order:   Diet Order            Diet regular Room service appropriate? Yes; Fluid consistency: Thin  Diet effective now              EDUCATION NEEDS:   No education needs have been identified at this time  Skin:  Skin Assessment: Reviewed RN Assessment  Last BM:  12/12  Height:   Ht Readings from  Last 1 Encounters:  11/21/19 6\' 2"  (1.88 m)    Weight:   Wt Readings from Last 1 Encounters:  11/21/19 99.6 kg    Ideal Body Weight:  86.3 kg  BMI:  Body mass index is 28.19 kg/m.  Estimated Nutritional Needs:   Kcal:  2200-2400  Protein:  105-115g  Fluid:  2L/day  Clayton Bibles, MS, RD, LDN Inpatient Clinical Dietitian Pager: [phone] After Hours Pager: [phone]

## 2019-11-23 LAB — BASIC METABOLIC PANEL
Anion gap: 6 (ref 5–15)
BUN: 10 mg/dL (ref 6–20)
CO2: 24 mmol/L (ref 22–32)
Calcium: 9.3 mg/dL (ref 8.9–10.3)
Chloride: 101 mmol/L (ref 98–111)
Creatinine, Ser: 1.17 mg/dL (ref 0.61–1.24)
GFR calc Af Amer: >60 mL/min (ref >60)
GFR calc non Af Amer: >60 mL/min (ref >60)
Glucose, Bld: 123 mg/dL — ABNORMAL HIGH (ref 70–99)
Potassium: 3.6 mmol/L (ref 3.5–5.1)
Sodium: 131 mmol/L — ABNORMAL LOW (ref 135–145)

## 2019-11-23 LAB — CBC WITH DIFFERENTIAL/PLATELET
Abs Immature Granulocytes: 0.16 10*3/uL — ABNORMAL HIGH (ref 0.00–0.07)
Basophils Absolute: 0 10*3/uL (ref 0.0–0.1)
Basophils Relative: 0 %
Eosinophils Absolute: 0 10*3/uL (ref 0.0–0.5)
Eosinophils Relative: 0 %
HCT: 32.6 % — ABNORMAL LOW (ref 39.0–52.0)
Hemoglobin: 11.3 g/dL — ABNORMAL LOW (ref 13.0–17.0)
Immature Granulocytes: 1 %
Lymphocytes Relative: 13 %
Lymphs Abs: 1.8 10*3/uL (ref 0.7–4.0)
MCH: 36.3 pg — ABNORMAL HIGH (ref 26.0–34.0)
MCHC: 34.7 g/dL (ref 30.0–36.0)
MCV: 104.8 fL — ABNORMAL HIGH (ref 80.0–100.0)
Monocytes Absolute: 0.7 10*3/uL (ref 0.1–1.0)
Monocytes Relative: 5 %
Neutro Abs: 10.4 10*3/uL — ABNORMAL HIGH (ref 1.7–7.7)
Neutrophils Relative %: 81 %
Platelets: 325 10*3/uL (ref 150–400)
RBC: 3.11 MIL/uL — ABNORMAL LOW (ref 4.22–5.81)
RDW: 16.6 % — ABNORMAL HIGH (ref 11.5–15.5)
WBC: 13 10*3/uL — ABNORMAL HIGH (ref 4.0–10.5)
nRBC: 0.2 % (ref 0.0–0.2)

## 2019-11-23 LAB — GLUCOSE, CAPILLARY
Glucose-Capillary: 126 mg/dL — ABNORMAL HIGH (ref 70–99)
Glucose-Capillary: 123 mg/dL — ABNORMAL HIGH (ref 70–99)
Glucose-Capillary: 131 mg/dL — ABNORMAL HIGH (ref 70–99)
Glucose-Capillary: 107 mg/dL — ABNORMAL HIGH (ref 70–99)
Glucose-Capillary: 109 mg/dL — ABNORMAL HIGH (ref 70–99)

## 2019-11-23 NOTE — Progress Notes (Signed)
   11/23/19 1300  Clinical Encounter Type  Visited With Patient  Visit Type Initial;Psychological support;Spiritual support  Referral From Nurse  Consult/Referral To Chaplain  Spiritual Encounters  Spiritual Needs Emotional;Brochure;Other (Comment) (Advance Directive )  Stress Factors  Patient Stress Factors Other (Comment) (Advance Directive )  Advance Directives (For Healthcare)  Does Patient Have a Medical Advance Directive? No  Would patient like information on creating a medical advance directive? Yes (Inpatient - patient requests chaplain consult to create a medical advance directive)   I spoke with Mr. Aubry per spiritual care consult to discuss an Advance Directive. He was provided the paperwork for the Advance Directive. Mr. Blansett states that he would like his friend, Francella Solian, to be his Healthcare Agent in the event that he is ever unable to communicate his healthcare wishes.  I will stop by tomorrow to complete the document with Mr. Camerer.    Chaplain Shanon Ace M.Div., [facility]

## 2019-11-23 NOTE — Evaluation (Signed)
Occupational Therapy Evaluation Patient Details Name: Bruce Simpson MRN: [id number] DOB: Apr 08, 1966 Today's Date: 11/23/2019    History of Present Illness 53 yo male admitted with myxedema, hypotension. Hx of ETOH abuse, COPD, myxedema coma, hypothyroidism, noncompliance. Pt is homeless.   Clinical Impression   Pt admitted with myxedema. Pt currently with functional limitations due to the deficits listed below (see OT Problem List).  Pt will benefit from skilled OT to increase their safety and independence with ADL and functional mobility for ADL to facilitate discharge to venue listed below.      Follow Up Recommendations  No OT follow up    Equipment Recommendations  None recommended by OT    Recommendations for Other Services       Precautions / Restrictions Precautions Precautions: Fall Restrictions Weight Bearing Restrictions: No      Mobility Bed Mobility Overal bed mobility: Modified Independent                Transfers Overall transfer level: Modified independent                    Balance Overall balance assessment: Needs assistance;Mild deficits observed, not formally tested                                         ADL either performed or assessed with clinical judgement   ADL Overall ADL's : Needs assistance/impaired Eating/Feeding: Set up;Sitting   Grooming: Wash/dry hands;Wash/dry face;Sitting;Set up   Upper Body Bathing: Set up;Sitting   Lower Body Bathing: Minimal assistance;Sit to/from stand;Cueing for safety;Cueing for sequencing   Upper Body Dressing : Set up;Sitting   Lower Body Dressing: Minimal assistance;Sit to/from stand;Cueing for safety;Cueing for sequencing   Toilet Transfer: Minimal assistance;Cueing for sequencing;Cueing for safety;Stand-pivot   Toileting- Clothing Manipulation and Hygiene: Minimal assistance;Sit to/from stand;Cueing for safety;Cueing for sequencing       Functional mobility  during ADLs: Minimal assistance;Cueing for safety;Cueing for sequencing       Vision Patient Visual Report: No change from baseline              Pertinent Vitals/Pain Pain Assessment: 0-10 Pain Score: 3  Pain Location: bil LEs Pain Descriptors / Indicators: Discomfort;Sore;Tightness Pain Intervention(s): Limited activity within patient's tolerance     Hand Dominance     Extremity/Trunk Assessment         Cervical / Trunk Assessment Cervical / Trunk Assessment: Normal   Communication Communication Communication: No difficulties   Cognition Arousal/Alertness: Awake/alert Behavior During Therapy: WFL for tasks assessed/performed Overall Cognitive Status: Within Functional Limits for tasks assessed                                                Home Living Family/patient expects to be discharged to:: Shelter/Homeless                                        Prior Functioning/Environment Level of Independence: Independent with assistive device(s)        Comments: has a walking stick/staff        OT Problem List: Decreased strength;Impaired balance (sitting and/or standing)  OT Treatment/Interventions: Self-care/ADL training;Patient/family education;DME and/or AE instruction;Therapeutic activities    OT Goals(Current goals can be found in the care plan section) Acute Rehab OT Goals Patient Stated Goal: regain PLOF/independence OT Goal Formulation: With patient Time For Goal Achievement: 11/30/19 Potential to Achieve Goals: Good ADL Goals Pt Will Perform Grooming: with modified independence;standing Pt Will Perform Lower Body Dressing: with modified independence;sit to/from stand Pt Will Transfer to Toilet: with modified independence;ambulating Pt Will Perform Toileting - Clothing Manipulation and hygiene: with modified independence;sit to/from stand  OT Frequency: Min 2X/week    AM-PAC OT "6 Clicks" Daily Activity      Outcome Measure Help from another person eating meals?: None Help from another person taking care of personal grooming?: None Help from another person toileting, which includes using toliet, bedpan, or urinal?: A Little Help from another person bathing (including washing, rinsing, drying)?: A Little Help from another person to put on and taking off regular upper body clothing?: None Help from another person to put on and taking off regular lower body clothing?: A Little 6 Click Score: 21   End of Session Nurse Communication: Mobility status  Activity Tolerance: Patient tolerated treatment well Patient left: in chair;with call bell/phone within reach  OT Visit Diagnosis: Unsteadiness on feet (R26.81);Other abnormalities of gait and mobility (R26.89);Muscle weakness (generalized) (M62.81)                Time: 4081-4481 OT Time Calculation (min): 23 min Charges:  OT General Charges $OT Visit: 1 Visit OT Evaluation $OT Eval Moderate Complexity: 1 Mod  Lise Auer, OT Acute Rehabilitation Services [phone] Office- [phone]     Brooklynne Pereida, Karin Golden D 11/23/2019, 3:54 PM

## 2019-11-23 NOTE — Progress Notes (Signed)
PROGRESS NOTE    Bruce Simpson    Code Status: Full Code  WUJ:[id number] DOB: 02/09/1966 DOA: 11/21/2019  PCP: Lavinia SharpsPlacey, Mary Ann, NP    Hospital Summary  Bruce Simpson has PMH of alcohol abuse, emphysema, tobacco use, hypothyroidism with previous myxedema coma, and medication noncompliance, presents to the ED with complaints of shortness of breath and cough scantly productive of tan sputum x 1 week. In the ED, his O2 sats were stable on RA but he was noted to have hypothermia on arrival with temp of 97.57F and hypotension with BP of 81/64. He was started on IV fluids bolus and his BP and temperature improved. CXR was negative for consolidation though it showed emphysematous changes. Labwork was remarkable for TSH of 37.911, mild hyponatremia of 130, K of 3.2, Scr of 1.14. EKG showed prolonged QTc of 538 but no acute ST changes. COVID-19 test was negative. Pt admitted for further management.  A & P   Principal Problem:   Myxedema Active Problems:   Hypotension   Hypothermia   Hypokalemia   COPD with acute exacerbation (HCC)   Alcohol abuse   Hyponatremia   Tobacco use  Severe hypothyroidism versus less likely myxedema coma Homeless and has not been able to afford his Synthroid for the past year Hypotension, hypothermia on presentation Currently BP and temperature normalized Currently afebrile, with no leukocytosis TSH 37 on presentation, free T4 0.24 BC x2 negative ECHO with EF of 55-60% Status post IV fluids -Discontinue IV hydrocortisone as he has intact adrenal function -Continue Synthroid 100 mcg -Transfer out of SDU to med telemetry  Likely acute exacerbation of COPD Chest x-ray -Continue Rocephin, doxycycline -Continue inhalers  Hyponatremia Likely 2/2 hypothyroid versus myxedema, alcohol abuse + poor oral intake  Prolonged QTC Monitor EKG Avoid QTc prolonging meds Telemetry  Alcohol/tobacco abuse Advised to quit CIWA protocol Nicotine patch   DVT  prophylaxis: Lovenox Family Communication: No family at bedside Disposition Plan: Social worker consulted to help with medication assistance.  Patient wondering if there is any housing available for him.  Follow-up with PT OT eval  Consultants  None  Procedures  None  Antibiotics   Anti-infectives (From admission, onward)   Start     Dose/Rate Route Frequency Ordered Stop   11/22/19 1600  cefTRIAXone (ROCEPHIN) 1 g in sodium chloride 0.9 % 100 mL IVPB     1 g 200 mL/hr over 30 Minutes Intravenous Every 24 hours 11/21/19 1727 11/26/19 1559   11/21/19 2200  doxycycline (VIBRA-TABS) tablet 100 mg     100 mg Oral Every 12 hours 11/21/19 1714     11/21/19 1715  cefTRIAXone (ROCEPHIN) 1 g in sodium chloride 0.9 % 100 mL IVPB  Status:  Discontinued     1 g 200 mL/hr over 30 Minutes Intravenous Every 24 hours 11/21/19 1714 11/21/19 1727   11/21/19 1500  cefTRIAXone (ROCEPHIN) 1 g in sodium chloride 0.9 % 100 mL IVPB     1 g 200 mL/hr over 30 Minutes Intravenous  Once 11/21/19 1452 11/21/19 1734   11/21/19 1500  azithromycin (ZITHROMAX) 500 mg in sodium chloride 0.9 % 250 mL IVPB  Status:  Discontinued     500 mg 250 mL/hr over 60 Minutes Intravenous  Once 11/21/19 1452 11/21/19 1715           Subjective   Patient seen and examined at bedside in no acute distress and resting comfortably. No acute events overnight. Denies any acute complaints at this time. Tolerating  diet well.   Objective   Vitals:   11/23/19 1200 11/23/19 1300 11/23/19 1600 11/23/19 1700  BP:  (!) 119/96  137/90  Pulse: 88 91  90  Resp: (!) 21   17  Temp: 98 F (36.7 C)  (!) 97.5 F (36.4 C)   TempSrc: Oral  Oral   SpO2: 91% 97%  94%  Weight:      Height:        Intake/Output Summary (Last 24 hours) at 11/23/2019 1934 Last data filed at 11/23/2019 1807 Gross per 24 hour  Intake 1174.35 ml  Output 1050 ml  Net 124.35 ml   Filed Weights   11/21/19 1250 11/21/19 2100  Weight: 90.7 kg 99.6 kg     Examination:  Physical Exam Vitals and nursing note reviewed.  Constitutional:      Appearance: Normal appearance. He is obese.  HENT:     Head: Normocephalic and atraumatic.     Comments: Poor dentition    Nose: Nose normal.     Mouth/Throat:     Mouth: Mucous membranes are moist.  Eyes:     Extraocular Movements: Extraocular movements intact.  Cardiovascular:     Rate and Rhythm: Normal rate and regular rhythm.  Pulmonary:     Effort: Pulmonary effort is normal.     Breath sounds: Normal breath sounds.  Abdominal:     General: Abdomen is flat.     Palpations: Abdomen is soft.  Musculoskeletal:        General: No swelling. Normal range of motion.     Cervical back: Normal range of motion. No rigidity.  Neurological:     General: No focal deficit present.     Mental Status: He is alert. Mental status is at baseline.  Psychiatric:        Mood and Affect: Mood normal.        Behavior: Behavior normal.     Data Reviewed: I have personally reviewed following labs and imaging studies  CBC: Recent Labs  Lab 11/21/19 1259 11/21/19 2047 11/22/19 0209 11/23/19 0209  WBC 7.7 8.8 9.1 13.0*  NEUTROABS 4.6  --   --  10.4*  HGB 14.4 12.4* 12.2* 11.3*  HCT 41.0 35.8* 35.1* 32.6*  MCV 104.6* 105.9* 105.7* 104.8*  PLT 404* 321 334 325   Basic Metabolic Panel: Recent Labs  Lab 11/21/19 1259 11/21/19 1748 11/21/19 2047 11/22/19 0209 11/23/19 0209  NA 130*  --   --  131* 131*  K 3.2*  --   --  3.6 3.6  CL 94*  --   --  98 101  CO2 24  --   --  23 24  GLUCOSE 100*  --   --  167* 123*  BUN 6  --   --  9 10  CREATININE 1.14  --  1.04 1.08 1.17  CALCIUM 8.8*  --   --  8.6* 9.3  MG  --  2.2  --   --   --   PHOS  --  2.8  --   --   --    GFR: Estimated Creatinine Clearance: 94.2 mL/min (by C-G formula based on SCr of 1.17 mg/dL). Liver Function Tests: Recent Labs  Lab 11/21/19 1259 11/22/19 0209  AST 61* 49*  ALT 33 27  ALKPHOS 128* 108  BILITOT 0.9 0.8   PROT 7.0 6.4*  ALBUMIN 3.5 3.3*   No results for input(s): LIPASE, AMYLASE in the last 168 hours. No results for  input(s): AMMONIA in the last 168 hours. Coagulation Profile: No results for input(s): INR, PROTIME in the last 168 hours. Cardiac Enzymes: No results for input(s): CKTOTAL, CKMB, CKMBINDEX, TROPONINI in the last 168 hours. BNP (last 3 results) No results for input(s): PROBNP in the last 8760 hours. HbA1C: No results for input(s): HGBA1C in the last 72 hours. CBG: Recent Labs  Lab 11/22/19 2304 11/23/19 0308 11/23/19 0745 11/23/19 1155 11/23/19 1559  GLUCAP 151* 126* 123* 131* 107*   Lipid Profile: No results for input(s): CHOL, HDL, LDLCALC, TRIG, CHOLHDL, LDLDIRECT in the last 72 hours. Thyroid Function Tests: Recent Labs    11/21/19 1304 11/21/19 2047  TSH 37.911*  --   FREET4  --  0.24*   Anemia Panel: No results for input(s): VITAMINB12, FOLATE, FERRITIN, TIBC, IRON, RETICCTPCT in the last 72 hours. Sepsis Labs: Recent Labs  Lab 11/21/19 1310  LATICACIDVEN 1.8    Recent Results (from the past 240 hour(s))  Culture, blood (routine x 2)     Status: None (Preliminary result)   Collection Time: 11/21/19  1:04 PM   Specimen: BLOOD  Result Value Ref Range Status   Specimen Description   Final    BLOOD BLOOD LEFT FOREARM Performed at [facility], [address], [place], [place] [address]    Special Requests   Final    BOTTLES DRAWN AEROBIC AND ANAEROBIC Blood Culture results may not be optimal due to an excessive volume of blood received in culture bottles   Culture   Final    NO GROWTH 2 DAYS Performed at [facility], [address] [place], [place] [address]    Report Status PENDING  Incomplete  Culture, blood (routine x 2)     Status: None (Preliminary result)   Collection Time: 11/21/19  1:12 PM   Specimen: BLOOD  Result Value Ref Range Status   Specimen Description   Final    BLOOD BLOOD RIGHT HAND Performed  at [facility], [address] [place], [place] [address]    Special Requests   Final    BOTTLES DRAWN AEROBIC AND ANAEROBIC Blood Culture adequate volume   Culture   Final    NO GROWTH 2 DAYS Performed at [facility], [address], [place], [place] [address]    Report Status PENDING  Incomplete  SARS CORONAVIRUS 2 (TAT 6-24 HRS) Nasopharyngeal Nasopharyngeal Swab     Status: None   Collection Time: 11/21/19  2:49 PM   Specimen: Nasopharyngeal Swab  Result Value Ref Range Status   SARS Coronavirus 2 NEGATIVE NEGATIVE Final    Comment: (NOTE) SARS-CoV-2 target nucleic acids are NOT DETECTED. The SARS-CoV-2 RNA is generally detectable in upper and lower respiratory specimens during the acute phase of infection. Negative results do not preclude SARS-CoV-2 infection, do not rule out co-infections with other pathogens, and should not be used as the sole basis for treatment or other patient management decisions. Negative results must be combined with clinical observations, patient history, and epidemiological information. The expected result is Negative. Fact Sheet for Patients: HairSlick.no Fact Sheet for Healthcare Providers: quierodirigir.com This test is not yet approved or cleared by the [place] FDA and  has been authorized for detection and/or diagnosis of SARS-CoV-2 by FDA under an Emergency Use Authorization (EUA). This EUA will remain  in effect (meaning this test can be used) for the duration of the COVID-19 declaration under Section 56 4(b)(1) of the Act, 21 U.S.C.  section 360bbb-3(b)(1), unless the authorization is terminated or revoked sooner. Performed at [facility], [address], [place], [place] [address]   MRSA PCR Screening     Status: None   Collection Time: 11/22/19  4:00 PM   Specimen: Nasal Mucosa; Nasopharyngeal  Result Value Ref Range Status   MRSA by PCR  NEGATIVE NEGATIVE Final    Comment:        The GeneXpert MRSA Assay (FDA approved for NASAL specimens only), is one component of a comprehensive MRSA colonization surveillance program. It is not intended to diagnose MRSA infection nor to guide or monitor treatment for MRSA infections. Performed at [facility], [address], [place], [place] [address]          Radiology Studies: ECHOCARDIOGRAM COMPLETE  Result Date: 11/22/2019   ECHOCARDIOGRAM REPORT   Patient Name:   Bruce Simpson Date of Exam: 11/22/2019 Medical Rec #:  [id number]         Height:       74.0 in Accession #:    [id number]        Weight:       219.6 lb Date of Birth:  10/30/1966         BSA:          2.26 m Patient Age:    53 years          BP:           127/90 mmHg Patient Gender: M                 HR:           92 bpm. Exam Location:  Inpatient Procedure: 2D Echo, Color Doppler and Cardiac Doppler Indications:    R06.9 DOE  History:        Patient has prior history of Echocardiogram examinations, most                 recent 12/20/2017. COPD; Risk Factors:Current Smoker.  Sonographer:    Irving Burton Senior RDCS Referring Phys: [id number] Warrick Parisian J EZENDUKA IMPRESSIONS  1. Left ventricular ejection fraction, by visual estimation, is 55 to 60%. The left ventricle has normal function. There is no left ventricular hypertrophy.  2. The left ventricle demonstrates regional wall motion abnormalities.  3. No change from previous echo in 2019.  4. Global right ventricle has normal systolic function.The right ventricular size is normal. No increase in right ventricular wall thickness.  5. Left atrial size was normal.  6. Right atrial size was normal.  7. The mitral valve is normal in structure. Trivial mitral valve regurgitation.  8. The tricuspid valve is normal in structure. Tricuspid valve regurgitation is trivial.  9. The aortic valve is normal in structure. Aortic valve regurgitation is trivial. 10. The pulmonic  valve was normal in structure. Pulmonic valve regurgitation is not visualized. 11. Normal pulmonary artery systolic pressure. FINDINGS  Left Ventricle: Left ventricular ejection fraction, by visual estimation, is 55 to 60%. The left ventricle has normal function. The left ventricle demonstrates regional wall motion abnormalities. The left ventricular internal cavity size was the left ventricle is normal in size. There is no left ventricular hypertrophy. No change from previous echo in 2019. Right Ventricle: The right ventricular size is normal. No increase in right ventricular wall thickness. Global RV systolic function is has normal systolic function. The tricuspid regurgitant velocity is 1.76 m/s, and with an assumed right atrial pressure  of 3 mmHg, the  estimated right ventricular systolic pressure is normal at 15.4 mmHg. Left Atrium: Left atrial size was normal in size. Right Atrium: Right atrial size was normal in size Pericardium: There is no evidence of pericardial effusion. Mitral Valve: The mitral valve is normal in structure. Trivial mitral valve regurgitation. Tricuspid Valve: The tricuspid valve is normal in structure. Tricuspid valve regurgitation is trivial. Aortic Valve: The aortic valve is normal in structure. Aortic valve regurgitation is trivial. Pulmonic Valve: The pulmonic valve was normal in structure. Pulmonic valve regurgitation is not visualized. Pulmonic regurgitation is not visualized. Aorta: The aortic root is normal in size and structure. IAS/Shunts: No atrial level shunt detected by color flow Doppler.  LEFT VENTRICLE PLAX 2D LVIDd:         4.70 cm LVIDs:         3.90 cm LV PW:         1.10 cm LV IVS:        1.00 cm LVOT diam:     2.00 cm LV SV:         36 ml LV SV Index:   15.87 LVOT Area:     3.14 cm  LV Volumes (MOD) LV area d, A2C:    28.20 cm LV area d, A4C:    28.90 cm LV area s, A2C:    17.40 cm LV area s, A4C:    15.00 cm LV major d, A2C:   8.48 cm LV major d, A4C:   8.31 cm  LV major s, A2C:   7.02 cm LV major s, A4C:   6.44 cm LV vol d, MOD A2C: 79.1 ml LV vol d, MOD A4C: 83.2 ml LV vol s, MOD A2C: 36.4 ml LV vol s, MOD A4C: 28.9 ml LV SV MOD A2C:     42.7 ml LV SV MOD A4C:     83.2 ml LV SV MOD BP:      48.1 ml RIGHT VENTRICLE RV S prime:     12.80 cm/s TAPSE (M-mode): 2.5 cm LEFT ATRIUM             Index       RIGHT ATRIUM           Index LA diam:        4.00 cm 1.77 cm/m  RA Area:     14.80 cm LA Vol (A2C):   28.6 ml 12.65 ml/m RA Volume:   38.20 ml  16.89 ml/m LA Vol (A4C):   35.5 ml 15.70 ml/m LA Biplane Vol: 35.0 ml 15.48 ml/m  AORTIC VALVE LVOT Vmax:   92.70 cm/s LVOT Vmean:  72.700 cm/s LVOT VTI:    0.155 m  AORTA Ao Root diam: 3.40 cm Ao Asc diam:  3.70 cm TRICUSPID VALVE TR Peak grad:   12.4 mmHg TR Vmax:        176.00 cm/s  SHUNTS Systemic VTI:  0.16 m Systemic Diam: 2.00 cm  Dorris Carnes MD Electronically signed by Dorris Carnes MD Signature Date/Time: 11/22/2019/11:51:51 AM    Final         Scheduled Meds:  Chlorhexidine Gluconate Cloth  6 each Topical Daily   docusate sodium  100 mg Oral BID   doxycycline  100 mg Oral Q12H   enoxaparin (LOVENOX) injection  40 mg Subcutaneous Q24H   feeding supplement (ENSURE ENLIVE)  237 mL Oral H37J   folic acid  1 mg Oral Daily   guaiFENesin  600 mg Oral BID   levothyroxine  100  mcg Oral Q0600   mouth rinse  15 mL Mouth Rinse BID   multivitamin with minerals  1 tablet Oral Daily   nicotine  21 mg Transdermal Daily   sodium chloride flush  3 mL Intravenous Q12H   thiamine  100 mg Oral Daily   umeclidinium bromide  1 puff Inhalation Daily   Continuous Infusions:  cefTRIAXone (ROCEPHIN)  IV Stopped (11/23/19 1714)     LOS: 2 days    Time spent: 25 minutes with over 50% of the time coordinating the patient's care    Jae Dire, DO [facility] Pager [phone]  If 7PM-7AM, please contact night-coverage [email or web address] Password [facility] 11/23/2019, 7:34 PM

## 2019-11-24 DIAGNOSIS — J189 Pneumonia, unspecified organism: Secondary | ICD-10-CM

## 2019-11-24 DIAGNOSIS — F1010 Alcohol abuse, uncomplicated: Secondary | ICD-10-CM

## 2019-11-24 DIAGNOSIS — J441 Chronic obstructive pulmonary disease with (acute) exacerbation: Secondary | ICD-10-CM

## 2019-11-24 DIAGNOSIS — E039 Hypothyroidism, unspecified: Principal | ICD-10-CM

## 2019-11-24 LAB — GLUCOSE, CAPILLARY
Glucose-Capillary: 110 mg/dL — ABNORMAL HIGH (ref 70–99)
Glucose-Capillary: 138 mg/dL — ABNORMAL HIGH (ref 70–99)
Glucose-Capillary: 140 mg/dL — ABNORMAL HIGH (ref 70–99)
Glucose-Capillary: 141 mg/dL — ABNORMAL HIGH (ref 70–99)
Glucose-Capillary: 86 mg/dL (ref 70–99)
Glucose-Capillary: 87 mg/dL (ref 70–99)
Glucose-Capillary: 90 mg/dL (ref 70–99)
Glucose-Capillary: 95 mg/dL (ref 70–99)

## 2019-11-24 LAB — CBC WITH DIFFERENTIAL/PLATELET
Abs Immature Granulocytes: 0.19 10*3/uL — ABNORMAL HIGH (ref 0.00–0.07)
Basophils Absolute: 0.1 10*3/uL (ref 0.0–0.1)
Basophils Relative: 1 %
Eosinophils Absolute: 0 10*3/uL (ref 0.0–0.5)
Eosinophils Relative: 0 %
HCT: 33.9 % — ABNORMAL LOW (ref 39.0–52.0)
Hemoglobin: 11.5 g/dL — ABNORMAL LOW (ref 13.0–17.0)
Immature Granulocytes: 2 %
Lymphocytes Relative: 21 %
Lymphs Abs: 2.4 10*3/uL (ref 0.7–4.0)
MCH: 37 pg — ABNORMAL HIGH (ref 26.0–34.0)
MCHC: 33.9 g/dL (ref 30.0–36.0)
MCV: 109 fL — ABNORMAL HIGH (ref 80.0–100.0)
Monocytes Absolute: 0.7 10*3/uL (ref 0.1–1.0)
Monocytes Relative: 6 %
Neutro Abs: 7.8 10*3/uL — ABNORMAL HIGH (ref 1.7–7.7)
Neutrophils Relative %: 70 %
Platelets: 357 10*3/uL (ref 150–400)
RBC: 3.11 MIL/uL — ABNORMAL LOW (ref 4.22–5.81)
RDW: 17.8 % — ABNORMAL HIGH (ref 11.5–15.5)
WBC: 11.2 10*3/uL — ABNORMAL HIGH (ref 4.0–10.5)
nRBC: 0.2 % (ref 0.0–0.2)

## 2019-11-24 LAB — BASIC METABOLIC PANEL
Anion gap: 9 (ref 5–15)
BUN: 13 mg/dL (ref 6–20)
CO2: 27 mmol/L (ref 22–32)
Calcium: 9.1 mg/dL (ref 8.9–10.3)
Chloride: 101 mmol/L (ref 98–111)
Creatinine, Ser: 1.31 mg/dL — ABNORMAL HIGH (ref 0.61–1.24)
GFR calc Af Amer: >60 mL/min (ref >60)
GFR calc non Af Amer: >60 mL/min (ref >60)
Glucose, Bld: 82 mg/dL (ref 70–99)
Potassium: 3.4 mmol/L — ABNORMAL LOW (ref 3.5–5.1)
Sodium: 137 mmol/L (ref 135–145)

## 2019-11-24 MED ORDER — UMECLIDINIUM BROMIDE 62.5 MCG/INH IN AEPB: 1.0000 | INHALATION_SPRAY | Freq: Every day | RESPIRATORY_TRACT | 1 refills | Status: DC

## 2019-11-24 MED ORDER — GUAIFENESIN ER 600 MG PO TB12: 600.0000 mg | ORAL_TABLET | Freq: Two times a day (BID) | ORAL | 0 refills | Status: DC

## 2019-11-24 MED ORDER — ALBUTEROL SULFATE HFA 108 (90 BASE) MCG/ACT IN AERS: 2.0000 | INHALATION_SPRAY | Freq: Four times a day (QID) | RESPIRATORY_TRACT | 3 refills | Status: DC | PRN

## 2019-11-24 MED ORDER — LEVOTHYROXINE SODIUM 100 MCG PO TABS: 100.0000 ug | ORAL_TABLET | Freq: Every day | ORAL | 4 refills | Status: AC

## 2019-11-24 MED ORDER — DOXYCYCLINE HYCLATE 100 MG PO TABS: 100.0000 mg | ORAL_TABLET | Freq: Two times a day (BID) | ORAL | 0 refills | Status: DC

## 2019-11-24 MED ORDER — SODIUM CHLORIDE 0.9 % IV SOLN
INTRAVENOUS | Status: AC
  Administered 2019-11-24: 75 mL/h via INTRAVENOUS

## 2019-11-24 MED ORDER — LEVOTHYROXINE SODIUM 100 MCG PO TABS: 100.0000 ug | ORAL_TABLET | Freq: Every day | ORAL | 4 refills | Status: DC

## 2019-11-24 MED ORDER — GUAIFENESIN ER 600 MG PO TB12: 600.0000 mg | ORAL_TABLET | Freq: Two times a day (BID) | ORAL | 0 refills | Status: AC

## 2019-11-24 MED ORDER — DOXYCYCLINE HYCLATE 100 MG PO TABS: 100.0000 mg | ORAL_TABLET | Freq: Two times a day (BID) | ORAL | 0 refills | Status: AC

## 2019-11-24 MED ORDER — IPRATROPIUM-ALBUTEROL 0.5-2.5 (3) MG/3ML IN SOLN
3.0000 mL | Freq: Four times a day (QID) | RESPIRATORY_TRACT | Status: DC
  Administered 2019-11-24 – 2019-11-25 (×5): 3 mL via RESPIRATORY_TRACT
  Filled 2019-11-24 (×5): qty 3

## 2019-11-24 MED FILL — DOXYCYCLINE HYCLATE 100 MG: 100 | 5 days supply | Qty: 10 | Fill #0

## 2019-11-24 MED FILL — LEVOTHYROXINE SODIUM 100 MC: 100 | 30 days supply | Qty: 30 | Fill #0

## 2019-11-24 MED FILL — INCRUSE ELLIPTA 62.5 MCG IN: 62.5 | 30 days supply | Qty: 30 | Fill #0

## 2019-11-24 MED FILL — ALBUTEROL SULFATE HFA 108 (: 108 (90 BAS | 25 days supply | Qty: 18 | Fill #0

## 2019-11-24 NOTE — Progress Notes (Signed)
Physical Therapy Treatment Patient Details Name: Amram Maya MRN: [id number] DOB: 03-03-1966 Today's Date: 11/24/2019    History of Present Illness 53 yo male admitted with myxedema, hypotension. Hx of ETOH abuse, COPD, myxedema coma, hypothyroidism, noncompliance. Pt is homeless.    PT Comments    Per chart and RN, pt may possibly d/c today. Pt expresses some concern due to the weather since he is homeless. He is awaiting a consult from the SW. He mobilized well on today. O2 fluctuated between 86-90% on RA during session-left Elberta off at end of session-made RN aware.    Follow Up Recommendations  No PT follow up     Equipment Recommendations  None recommended by PT    Recommendations for Other Services       Precautions / Restrictions Precautions Precautions: Fall Precaution Comments: monitor O2 Restrictions Weight Bearing Restrictions: No    Mobility  Bed Mobility Overal bed mobility: Modified Independent                Transfers Overall transfer level: Modified independent                  Ambulation/Gait Ambulation/Gait assistance: Supervision Gait Distance (Feet): 350 Feet Assistive device: None Gait Pattern/deviations: Step-through pattern;Decreased stride length     General Gait Details: O2 fluctuated between 87-90% on RA. dyspnea 2/4. HR fluctuated as well. Pt seemed to tolerate activity well.   Stairs             Wheelchair Mobility    Modified Rankin (Stroke Patients Only)       Balance Overall balance assessment: Mild deficits observed, not formally tested                                          Cognition Arousal/Alertness: Awake/alert Behavior During Therapy: WFL for tasks assessed/performed Overall Cognitive Status: Within Functional Limits for tasks assessed                                        Exercises      General Comments        Pertinent Vitals/Pain Pain  Assessment: No/denies pain    Home Living                      Prior Function            PT Goals (current goals can now be found in the care plan section) Progress towards PT goals: Progressing toward goals    Frequency    Min 3X/week      PT Plan Current plan remains appropriate    Co-evaluation              AM-PAC PT "6 Clicks" Mobility   Outcome Measure  Help needed turning from your back to your side while in a flat bed without using bedrails?: None Help needed moving from lying on your back to sitting on the side of a flat bed without using bedrails?: None Help needed moving to and from a bed to a chair (including a wheelchair)?: None Help needed standing up from a chair using your arms (e.g., wheelchair or bedside chair)?: None Help needed to walk in hospital room?: A Little Help needed climbing 3-5 steps with a railing? : A  Little 6 Click Score: 22    End of Session   Activity Tolerance: Patient tolerated treatment well Patient left: in bed;with call bell/phone within reach   PT Visit Diagnosis: Unsteadiness on feet (R26.81)     Time: 2951-8841 PT Time Calculation (min) (ACUTE ONLY): 22 min  Charges:  $Gait Training: 8-22 mins                        Faye Ramsay, PT Acute Rehabilitation Services

## 2019-11-24 NOTE — TOC Transition Note (Signed)
Transition of Care [facility]) - CM/SW Discharge Note   Patient Details  Name: Bruce Simpson MRN: [id number] Date of Birth: 21-Aug-1966  [facility] CM/SW Contact:  Gregg Winchell, Marjie Skiff, RN Phone Number: 11/24/2019, 11:01 AM   Clinical Narrative:    [facility] contacted to see if any beds available at any local shelters. No beds available at this time. Match letter provided to pt and override for copays was done. Pt states he plans to go to [facility] outpatient pharmacy and then catch the bus to the [facility]. Pt states he sees the NP Marliss Coots) at [facility] and plans to follow up there.      Discharge Plan and Services   Discharge Planning Services: CM Consult, [facility]                Social Determinants of Health (SDOH) Interventions     Readmission Risk Interventions No flowsheet data found.

## 2019-11-24 NOTE — Progress Notes (Signed)
[facility]                                                                              Patient Demographics  Bruce Simpson, is a 53 y.o. male, DOB - 1966/05/16, ZOX:[id number]  Admit date - 11/21/2019   Admitting Physician Ky Barban, MD  Outpatient Primary MD for the patient is Placey, Chales Abrahams, NP  Outpatient specialists:   LOS - 3  days   Medical records reviewed and are as summarized below:    Chief Complaint  Patient presents with  . Cough  . Nasal Congestion  . Hypotension  . Shortness of Breath       Brief summary   Bruce Simpson has PMH of alcohol abuse, emphysema, tobacco use, hypothyroidism with previous myxedema coma, and medication noncompliance,presentsto the ED with complaints of shortness of breath and cough scantly productive of tan sputum x 1 week.In the ED, his O2 sats were stable on RA but he was noted to have hypothermia on arrival with temp of 97.39F and hypotension with BP of 81/64. He was started on IV fluids bolus and his BP and temperature improved. CXR was negative for consolidation though it showed emphysematous changes.Labwork was remarkable for TSH of 37.911, mild hyponatremia of 130, K of 3.2, Scr of 1.14.EKG showed prolonged QTc of 538 but no acute ST changes. COVID-19 test was negative.Pt admitted for further management.   Assessment & Plan   Severe hypothyroidism versus myxedema -Patient presented with shortness of breath, hypothermia, hypotension -Patient was placed on IV fluids -Patient has not been able to afford Synthroid for the past year, TSH 37.9 at the time of admission free T4 low 0.24 -Currently alert and oriented, vital signs stable, continue Synthroid. -IV hydrocortisone discontinued. -Blood cultures negative till date, 2D echo showed EF of 55 to 60%.   Mild acute COPD exacerbation -Wheezing bilaterally, counseled patient on smoking cessation -Continue Rocephin, doxycycline, placed on scheduled  nebs  Hyponatremia -Likely due to profound hypothyroidism, sodium now improving 137  Mild AKI -Creatinine today 1.3, was 1.1 on 12/15 -Placed on gentle hydration  Prolonged QTC -Monitor EKG, avoid QTC prolonging meds, keep potassium above 4, mag> 2  Alcohol/tobacco use -Counseled on smoking cessation -Continue CIWA protocol with Ativan   Code Status: Full CODE STATUS DVT Prophylaxis:  Lovenox  Family Communication: Discussed all imaging results, lab results, explained to the patient    Disposition Plan: Patient reported that he is homeless, requested social work consult, currently no beds available at any shelters.  Patient has nowhere to go, outside temperature in low 30s and heavily raining, unsafe discharge at this time, will transfer to the floor.  Will request social work to check availability for beds at shelters tomorrow  Time Spent in minutes   35 minutes Procedures:  None  Consultants:   None  Antimicrobials:   Anti-infectives (From admission, onward)   Start     Dose/Rate Route Frequency Ordered Stop   11/24/19 0000  doxycycline (VIBRA-TABS) 100 MG tablet  Status:  Discontinued     100 mg Oral 2 times daily 11/24/19 0810 11/24/19    11/24/19 0000  doxycycline (VIBRA-TABS) 100 MG tablet  Status:  Discontinued     100 mg Oral 2 times daily 11/24/19 0816 11/24/19    11/24/19 0000  doxycycline (VIBRA-TABS) 100 MG tablet     100 mg Oral 2 times daily 11/24/19 0928 11/29/19 2359   11/22/19 1600  cefTRIAXone (ROCEPHIN) 1 g in sodium chloride 0.9 % 100 mL IVPB     1 g 200 mL/hr over 30 Minutes Intravenous Every 24 hours 11/21/19 1727 11/26/19 1559   11/21/19 2200  doxycycline (VIBRA-TABS) tablet 100 mg     100 mg Oral Every 12 hours 11/21/19 1714     11/21/19 1715  cefTRIAXone (ROCEPHIN) 1 g in sodium chloride 0.9 % 100 mL IVPB  Status:  Discontinued     1 g 200 mL/hr over 30 Minutes Intravenous Every 24 hours 11/21/19 1714 11/21/19 1727   11/21/19 1500   cefTRIAXone (ROCEPHIN) 1 g in sodium chloride 0.9 % 100 mL IVPB     1 g 200 mL/hr over 30 Minutes Intravenous  Once 11/21/19 1452 11/21/19 1734   11/21/19 1500  azithromycin (ZITHROMAX) 500 mg in sodium chloride 0.9 % 250 mL IVPB  Status:  Discontinued     500 mg 250 mL/hr over 60 Minutes Intravenous  Once 11/21/19 1452 11/21/19 1715          Medications  Scheduled Meds: . Chlorhexidine Gluconate Cloth  6 each Topical Daily  . docusate sodium  100 mg Oral BID  . doxycycline  100 mg Oral Q12H  . enoxaparin (LOVENOX) injection  40 mg Subcutaneous Q24H  . feeding supplement (ENSURE ENLIVE)  237 mL Oral Q24H  . folic acid  1 mg Oral Daily  . guaiFENesin  600 mg Oral BID  . ipratropium-albuterol  3 mL Nebulization QID  . levothyroxine  100 mcg Oral Q0600  . mouth rinse  15 mL Mouth Rinse BID  . multivitamin with minerals  1 tablet Oral Daily  . nicotine  21 mg Transdermal Daily  . sodium chloride flush  3 mL Intravenous Q12H  . thiamine  100 mg Oral Daily   Continuous Infusions: . cefTRIAXone (ROCEPHIN)  IV Stopped (11/23/19 1714)   PRN Meds:.acetaminophen **OR** acetaminophen, alum & mag hydroxide-simeth, ibuprofen, LORazepam **OR** LORazepam, magnesium hydroxide      Subjective:   Bruce Simpson was seen and examined today.  Bilateral wheezing, occasional coughing.  No fevers or chills.  States he is homeless and nowhere to go today.  No nausea vomiting or abdominal pain. Objective:   Vitals:   11/24/19 0824 11/24/19 0900 11/24/19 1128 11/24/19 1303  BP:  (!) 136/102  (!) 140/94  Pulse:  83  89  Resp:  18    Temp:   97.7 F (36.5 C)   TempSrc:   Oral   SpO2: 93% (!) 89%  92%  Weight:      Height:        Intake/Output Summary (Last 24 hours) at 11/24/2019 1347 Last data filed at 11/24/2019 1300 Gross per 24 hour  Intake 1171.35 ml  Output 1475 ml  Net -303.65 ml     Wt Readings from Last 3 Encounters:  11/21/19 99.6 kg  02/05/18 90 kg  01/05/18 99.9 kg      Exam  General: Alert and oriented x 3, NAD  Eyes:  HEENT:  Atraumatic, normocephalic, normal oropharynx  Cardiovascular: S1 S2 auscultated, no murmurs, RRR  Respiratory: Clear to auscultation bilaterally, no wheezing, rales or rhonchi  Gastrointestinal: Soft, nontender,  nondistended, + bowel sounds  Ext: no pedal edema bilaterally  Neuro: AAOx3, Cr N's II- XII. Strength 5/5 upper and lower extremities bilaterally  Musculoskeletal: No digital cyanosis, clubbing  Skin: No rashes  Psych: Normal affect and demeanor, alert and oriented x3    Data Reviewed:  I have personally reviewed following labs and imaging studies  Micro Results Recent Results (from the past 240 hour(s))  Culture, blood (routine x 2)     Status: None (Preliminary result)   Collection Time: 11/21/19  1:04 PM   Specimen: BLOOD  Result Value Ref Range Status   Specimen Description   Final    BLOOD BLOOD LEFT FOREARM Performed at [facility], [address] [place], [place] [address]    Special Requests   Final    BOTTLES DRAWN AEROBIC AND ANAEROBIC Blood Culture results may not be optimal due to an excessive volume of blood received in culture bottles   Culture   Final    NO GROWTH 3 DAYS Performed at [facility], [address] [place], [place] [address]    Report Status PENDING  Incomplete  Culture, blood (routine x 2)     Status: None (Preliminary result)   Collection Time: 11/21/19  1:12 PM   Specimen: BLOOD  Result Value Ref Range Status   Specimen Description   Final    BLOOD BLOOD RIGHT HAND Performed at [facility], [address] [place], [place] [address]    Special Requests   Final    BOTTLES DRAWN AEROBIC AND ANAEROBIC Blood Culture adequate volume   Culture   Final    NO GROWTH 3 DAYS Performed at [facility], [address] [place], [place] [address]    Report Status PENDING  Incomplete  SARS CORONAVIRUS 2 (TAT 6-24  HRS) Nasopharyngeal Nasopharyngeal Swab     Status: None   Collection Time: 11/21/19  2:49 PM   Specimen: Nasopharyngeal Swab  Result Value Ref Range Status   SARS Coronavirus 2 NEGATIVE NEGATIVE Final    Comment: (NOTE) SARS-CoV-2 target nucleic acids are NOT DETECTED. The SARS-CoV-2 RNA is generally detectable in upper and lower respiratory specimens during the acute phase of infection. Negative results do not preclude SARS-CoV-2 infection, do not rule out co-infections with other pathogens, and should not be used as the sole basis for treatment or other patient management decisions. Negative results must be combined with clinical observations, patient history, and epidemiological information. The expected result is Negative. Fact Sheet for Patients: HairSlick.nohttps://www.fda.gov/media/138098/download Fact Sheet for Healthcare Providers: quierodirigir.comhttps://www.fda.gov/media/138095/download This test is not yet approved or cleared by the [place] FDA and  has been authorized for detection and/or diagnosis of SARS-CoV-2 by FDA under an Emergency Use Authorization (EUA). This EUA will remain  in effect (meaning this test can be used) for the duration of the COVID-19 declaration under Section 56 4(b)(1) of the Act, 21 U.S.C. section 360bbb-3(b)(1), unless the authorization is terminated or revoked sooner. Performed at [facility], [address] [place], [place] [address]   MRSA PCR Screening     Status: None   Collection Time: 11/22/19  4:00 PM   Specimen: Nasal Mucosa; Nasopharyngeal  Result Value Ref Range Status   MRSA by PCR NEGATIVE NEGATIVE Final    Comment:        The GeneXpert MRSA Assay (FDA approved for NASAL specimens only), is one component of a comprehensive MRSA colonization surveillance program. It is not  intended to diagnose MRSA infection nor to guide or monitor treatment for MRSA infections. Performed at [facility], [address],  [place], [place] [address]     Radiology Reports DG Chest 2 View  Result Date: 11/21/2019 CLINICAL DATA:  Shortness of breath EXAM: CHEST - 2 VIEW COMPARISON:  01/01/2018 FINDINGS: The heart size and mediastinal contours are within normal limits. Probable emphysema. The visualized skeletal structures are unremarkable. IMPRESSION: Probable emphysema.  No acute abnormality of the lungs. Electronically Signed   By: Lauralyn Primes M.D.   On: 11/21/2019 13:39   ECHOCARDIOGRAM COMPLETE  Result Date: 11/22/2019   ECHOCARDIOGRAM REPORT   Patient Name:   SHAWON DENZER [facility] Date of Exam: 11/22/2019 Medical Rec #:  [id number]         Height:       74.0 in Accession #:    [id number]        Weight:       219.6 lb Date of Birth:  1966/06/02         BSA:          2.26 m Patient Age:    53 years          BP:           127/90 mmHg Patient Gender: M                 HR:           92 bpm. Exam Location:  Inpatient Procedure: 2D Echo, Color Doppler and Cardiac Doppler Indications:    R06.9 DOE  History:        Patient has prior history of Echocardiogram examinations, most                 recent 12/20/2017. COPD; Risk Factors:Current Smoker.  Sonographer:    Irving Burton Senior RDCS Referring Phys: [id number] Warrick Parisian J EZENDUKA IMPRESSIONS  1. Left ventricular ejection fraction, by visual estimation, is 55 to 60%. The left ventricle has normal function. There is no left ventricular hypertrophy.  2. The left ventricle demonstrates regional wall motion abnormalities.  3. No change from previous echo in 2019.  4. Global right ventricle has normal systolic function.The right ventricular size is normal. No increase in right ventricular wall thickness.  5. Left atrial size was normal.  6. Right atrial size was normal.  7. The mitral valve is normal in structure. Trivial mitral valve regurgitation.  8. The tricuspid valve is normal in structure. Tricuspid valve regurgitation is trivial.  9. The aortic valve is normal in structure. Aortic valve  regurgitation is trivial. 10. The pulmonic valve was normal in structure. Pulmonic valve regurgitation is not visualized. 11. Normal pulmonary artery systolic pressure. FINDINGS  Left Ventricle: Left ventricular ejection fraction, by visual estimation, is 55 to 60%. The left ventricle has normal function. The left ventricle demonstrates regional wall motion abnormalities. The left ventricular internal cavity size was the left ventricle is normal in size. There is no left ventricular hypertrophy. No change from previous echo in 2019. Right Ventricle: The right ventricular size is normal. No increase in right ventricular wall thickness. Global RV systolic function is has normal systolic function. The tricuspid regurgitant velocity is 1.76 m/s, and with an assumed right atrial pressure  of 3 mmHg, the estimated right ventricular systolic pressure is normal at 15.4 mmHg. Left Atrium: Left atrial size was normal in size. Right Atrium: Right atrial size was normal in size Pericardium: There is no evidence of  pericardial effusion. Mitral Valve: The mitral valve is normal in structure. Trivial mitral valve regurgitation. Tricuspid Valve: The tricuspid valve is normal in structure. Tricuspid valve regurgitation is trivial. Aortic Valve: The aortic valve is normal in structure. Aortic valve regurgitation is trivial. Pulmonic Valve: The pulmonic valve was normal in structure. Pulmonic valve regurgitation is not visualized. Pulmonic regurgitation is not visualized. Aorta: The aortic root is normal in size and structure. IAS/Shunts: No atrial level shunt detected by color flow Doppler.  LEFT VENTRICLE PLAX 2D LVIDd:         4.70 cm LVIDs:         3.90 cm LV PW:         1.10 cm LV IVS:        1.00 cm LVOT diam:     2.00 cm LV SV:         36 ml LV SV Index:   15.87 LVOT Area:     3.14 cm  LV Volumes (MOD) LV area d, A2C:    28.20 cm LV area d, A4C:    28.90 cm LV area s, A2C:    17.40 cm LV area s, A4C:    15.00 cm LV major  d, A2C:   8.48 cm LV major d, A4C:   8.31 cm LV major s, A2C:   7.02 cm LV major s, A4C:   6.44 cm LV vol d, MOD A2C: 79.1 ml LV vol d, MOD A4C: 83.2 ml LV vol s, MOD A2C: 36.4 ml LV vol s, MOD A4C: 28.9 ml LV SV MOD A2C:     42.7 ml LV SV MOD A4C:     83.2 ml LV SV MOD BP:      48.1 ml RIGHT VENTRICLE RV S prime:     12.80 cm/s TAPSE (M-mode): 2.5 cm LEFT ATRIUM             Index       RIGHT ATRIUM           Index LA diam:        4.00 cm 1.77 cm/m  RA Area:     14.80 cm LA Vol (A2C):   28.6 ml 12.65 ml/m RA Volume:   38.20 ml  16.89 ml/m LA Vol (A4C):   35.5 ml 15.70 ml/m LA Biplane Vol: 35.0 ml 15.48 ml/m  AORTIC VALVE LVOT Vmax:   92.70 cm/s LVOT Vmean:  72.700 cm/s LVOT VTI:    0.155 m  AORTA Ao Root diam: 3.40 cm Ao Asc diam:  3.70 cm TRICUSPID VALVE TR Peak grad:   12.4 mmHg TR Vmax:        176.00 cm/s  SHUNTS Systemic VTI:  0.16 m Systemic Diam: 2.00 cm  Dorris Carnes MD Electronically signed by Dorris Carnes MD Signature Date/Time: 11/22/2019/11:51:51 AM    Final     Lab Data:  CBC: Recent Labs  Lab 11/21/19 1259 11/21/19 2047 11/22/19 0209 11/23/19 0209 11/24/19 0228  WBC 7.7 8.8 9.1 13.0* 11.2*  NEUTROABS 4.6  --   --  10.4* 7.8*  HGB 14.4 12.4* 12.2* 11.3* 11.5*  HCT 41.0 35.8* 35.1* 32.6* 33.9*  MCV 104.6* 105.9* 105.7* 104.8* 109.0*  PLT 404* 321 334 325 010   Basic Metabolic Panel: Recent Labs  Lab 11/21/19 1259 11/21/19 1748 11/21/19 2047 11/22/19 0209 11/23/19 0209 11/24/19 0228  NA 130*  --   --  131* 131* 137  K 3.2*  --   --  3.6 3.6 3.4*  CL 94*  --   --  98 101 101  CO2 24  --   --  GLUCOSE 100*  --   --  167* 123* 82  BUN 6  --   --  CREATININE 1.14  --  1.04 1.08 1.17 1.31*  CALCIUM 8.8*  --   --  8.6* 9.3 9.1  MG  --  2.2  --   --   --   --   PHOS  --  2.8  --   --   --   --    GFR: Estimated Creatinine Clearance: 84.2 mL/min (A) (by C-G formula based on SCr of 1.31 mg/dL (H)). Liver Function Tests: Recent Labs  Lab 11/21/19 1259  11/22/19 0209  AST 61* 49*  ALT 33 27  ALKPHOS 128* 108  BILITOT 0.9 0.8  PROT 7.0 6.4*  ALBUMIN 3.5 3.3*   No results for input(s): LIPASE, AMYLASE in the last 168 hours. No results for input(s): AMMONIA in the last 168 hours. Coagulation Profile: No results for input(s): INR, PROTIME in the last 168 hours. Cardiac Enzymes: No results for input(s): CKTOTAL, CKMB, CKMBINDEX, TROPONINI in the last 168 hours. BNP (last 3 results) No results for input(s): PROBNP in the last 8760 hours. HbA1C: No results for input(s): HGBA1C in the last 72 hours. CBG: Recent Labs  Lab 11/23/19 2001 11/24/19 0011 11/24/19 0420 11/24/19 0742 11/24/19 1133  GLUCAP 109* 95 110* 87 86   Lipid Profile: No results for input(s): CHOL, HDL, LDLCALC, TRIG, CHOLHDL, LDLDIRECT in the last 72 hours. Thyroid Function Tests: Recent Labs    11/21/19 2047  FREET4 0.24*   Anemia Panel: No results for input(s): VITAMINB12, FOLATE, FERRITIN, TIBC, IRON, RETICCTPCT in the last 72 hours. Urine analysis:    Component Value Date/Time   COLORURINE YELLOW 11/22/2019 0400   APPEARANCEUR CLEAR 11/22/2019 0400   LABSPEC 1.017 11/22/2019 0400   PHURINE 6.0 11/22/2019 0400   GLUCOSEU NEGATIVE 11/22/2019 0400   HGBUR NEGATIVE 11/22/2019 0400   BILIRUBINUR NEGATIVE 11/22/2019 0400   KETONESUR 20 (A) 11/22/2019 0400   PROTEINUR NEGATIVE 11/22/2019 0400   NITRITE NEGATIVE 11/22/2019 0400   LEUKOCYTESUR TRACE (A) 11/22/2019 0400     Tamelia Michalowski M.D. [facility] 11/24/2019, 1:47 PM   Call night coverage person covering after 7pm

## 2019-11-24 NOTE — Discharge Summary (Signed)
EDCM entered co-pay override for pt Rx.

## 2019-11-25 LAB — GLUCOSE, CAPILLARY
Glucose-Capillary: 138 mg/dL — ABNORMAL HIGH (ref 70–99)
Glucose-Capillary: 84 mg/dL (ref 70–99)
Glucose-Capillary: 92 mg/dL (ref 70–99)

## 2019-11-25 LAB — CBC WITH DIFFERENTIAL/PLATELET
Abs Immature Granulocytes: 0.16 10*3/uL — ABNORMAL HIGH (ref 0.00–0.07)
Basophils Absolute: 0.1 10*3/uL (ref 0.0–0.1)
Basophils Relative: 1 %
Eosinophils Absolute: 0.1 10*3/uL (ref 0.0–0.5)
Eosinophils Relative: 1 %
HCT: 31.9 % — ABNORMAL LOW (ref 39.0–52.0)
Hemoglobin: 10.8 g/dL — ABNORMAL LOW (ref 13.0–17.0)
Immature Granulocytes: 2 %
Lymphocytes Relative: 24 %
Lymphs Abs: 2.5 10*3/uL (ref 0.7–4.0)
MCH: 37 pg — ABNORMAL HIGH (ref 26.0–34.0)
MCHC: 33.9 g/dL (ref 30.0–36.0)
MCV: 109.2 fL — ABNORMAL HIGH (ref 80.0–100.0)
Monocytes Absolute: 0.8 10*3/uL (ref 0.1–1.0)
Monocytes Relative: 8 %
Neutro Abs: 6.8 10*3/uL (ref 1.7–7.7)
Neutrophils Relative %: 64 %
Platelets: 316 10*3/uL (ref 150–400)
RBC: 2.92 MIL/uL — ABNORMAL LOW (ref 4.22–5.81)
RDW: 18 % — ABNORMAL HIGH (ref 11.5–15.5)
WBC: 10.4 10*3/uL (ref 4.0–10.5)
nRBC: 0 % (ref 0.0–0.2)

## 2019-11-25 LAB — BASIC METABOLIC PANEL
Anion gap: 11 (ref 5–15)
BUN: 14 mg/dL (ref 6–20)
CO2: 24 mmol/L (ref 22–32)
Calcium: 8.6 mg/dL — ABNORMAL LOW (ref 8.9–10.3)
Chloride: 102 mmol/L (ref 98–111)
Creatinine, Ser: 1.21 mg/dL (ref 0.61–1.24)
GFR calc Af Amer: >60 mL/min (ref >60)
GFR calc non Af Amer: >60 mL/min (ref >60)
Glucose, Bld: 92 mg/dL (ref 70–99)
Potassium: 3.3 mmol/L — ABNORMAL LOW (ref 3.5–5.1)
Sodium: 137 mmol/L (ref 135–145)

## 2019-11-25 MED ORDER — POTASSIUM CHLORIDE CRYS ER 20 MEQ PO TBCR
40.0000 meq | EXTENDED_RELEASE_TABLET | Freq: Once | ORAL | Status: DC
  Filled 2019-11-25: qty 2

## 2019-11-25 MED ORDER — POTASSIUM CHLORIDE CRYS ER 20 MEQ PO TBCR
40.0000 meq | EXTENDED_RELEASE_TABLET | Freq: Once | ORAL | Status: AC
  Administered 2019-11-25: 40 meq via ORAL
  Filled 2019-11-25: qty 2

## 2019-11-25 NOTE — Discharge Summary (Signed)
Physician Discharge Summary   Patient ID: Bruce Simpson MRN: [id number] DOB/AGE: 51-Mar-1967 51 y.o.  Admit date: 11/21/2019 Discharge date: 11/25/2019  Primary Care Physician:  Lavinia Sharps, NP   Recommendations for Outpatient Follow-up:  1. Follow up with PCP in 1-2 weeks 2. 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 COPD with acute exacerbation (HCC) . Hypotension . Hypokalemia . Hypothermia . Hyponatremia . Mild AKI . Alcohol abuse . Hyponatremia . Tobacco use   Consults: None    Allergies:  No Known Allergies   DISCHARGE MEDICATIONS: Allergies as of 11/25/2019   No Known Allergies     Medication List    TAKE these medications   albuterol 108 (90 Base) MCG/ACT inhaler Commonly known as: VENTOLIN HFA Inhale 2 puffs into the lungs every 6 (six) hours as needed for wheezing or shortness of breath.   doxycycline 100 MG tablet Commonly known as: VIBRA-TABS Take 1 tablet (100 mg total) by mouth 2 (two) times daily for 5 days.   guaiFENesin 600 MG 12 hr tablet Commonly known as: MUCINEX Take 1 tablet (600 mg total) by mouth 2 (two) times daily.   levothyroxine 100 MCG tablet Commonly known as: SYNTHROID Take 1 tablet (100 mcg total) by mouth daily before breakfast.   umeclidinium bromide 62.5 MCG/INH Aepb Commonly known as: INCRUSE ELLIPTA Inhale 1 puff into the lungs daily.        Brief H and P: For complete details please refer to admission H and P, but in brief *Bruce Simpson has PMH of alcohol abuse, emphysema, tobacco use, hypothyroidism with previous myxedema coma, and medication noncompliance,presentsto the ED with complaints of shortness of breath and cough scantly productive of tan sputum x 1 week.In the  ED, his O2 sats were stable on RA but he was noted to have hypothermia on arrival with temp of 97.9F and hypotension with BP of 81/64. He was started on IV fluids bolus and his BP and temperature improved. CXR was negative for consolidation though it showed emphysematous changes.Labwork was remarkable for TSH of 37.911, mild hyponatremia of 130, K of 3.2, Scr of 1.14.EKG showed prolonged QTc of 538 but no acute ST changes. COVID-19 test was negative.Pt admitted for further management.  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  Continue Synthroid at 100 MCG daily -Blood cultures negative till date, 2D echo showed EF of 55 to 60%. -Please check TSH, free T4, T3 in 4 weeks, adjust Synthroid dose based on the thyroid panel   Mild acute COPD exacerbation -Wheezing improved, patient counseled on smoking cessation. -Continue albuterol inhaler, Incruse Ellipta, doxycycline  Hyponatremia, hypokalemia -Likely due to profound hypothyroidism, sodium now improved to 137 -Potassium was replaced  Mild AKI -Creatinine today 1.3, was 1.1 on 12/15 -Placed on gentle hydration, creatinine 1.2 at the time of discharge  Prolonged QTC -Monitor EKG outpatient, avoid QTC prolonging meds, keep potassium above 4, mag> 2  Alcohol/tobacco use -Counseled on smoking cessation -Currently not in any acute  withdrawals, back to baseline  Social work was consulted to provide resources to the patient on shelters and housing.  Case management was consulted on match letter and med assistance.   Day of Discharge S: Doing well, no acute complaints, tolerating  diet  BP 139/88   Pulse 84   Temp 98 F (36.7 C) (Oral)   Resp 15   Ht 6\' 2"  (1.88 m)   Wt 99.6 kg   SpO2 90%   BMI 28.19 kg/m   Physical Exam: General: Alert and awake oriented x3 not in any acute distress. HEENT: anicteric sclera, pupils reactive to light and accommodation CVS: S1-S2 clear no murmur rubs or gallops Chest: clear to auscultation bilaterally, no wheezing rales or rhonchi Abdomen: soft nontender, nondistended, normal bowel sounds Extremities: no cyanosis, clubbing or edema noted bilaterally Neuro: Cranial nerves II-XII intact, no focal neurological deficits   The results of significant diagnostics from this hospitalization (including imaging, microbiology, ancillary and laboratory) are listed below for reference.      Procedures/Studies:  DG Chest 2 View  Result Date: 11/21/2019 CLINICAL DATA:  Shortness of breath EXAM: CHEST - 2 VIEW COMPARISON:  01/01/2018 FINDINGS: The heart size and mediastinal contours are within normal limits. Probable emphysema. The visualized skeletal structures are unremarkable. IMPRESSION: Probable emphysema.  No acute abnormality of the lungs. Electronically Signed   By: 01/03/2018 M.D.   On: 11/21/2019 13:39   ECHOCARDIOGRAM COMPLETE  Result Date: 11/22/2019   ECHOCARDIOGRAM REPORT   Patient Name:   Bruce Simpson [facility] Date of Exam: 11/22/2019 Medical Rec #:  11/24/2019         Height:       74.0 in Accession #:    [id number]        Weight:       219.6 lb Date of Birth:  September 14, 1966         BSA:          2.26 m Patient Age:    53 years          BP:           127/90 mmHg Patient Gender: M                 HR:           92 bpm. Exam Location:  Inpatient Procedure: 2D Echo, Color Doppler and Cardiac Doppler Indications:    R06.9 DOE  History:        Patient has prior history of Echocardiogram examinations, most                 recent 12/20/2017. COPD; Risk Factors:Current Smoker.  Sonographer:    02/17/2018 Senior RDCS Referring Phys: Irving Burton  [id number] J EZENDUKA IMPRESSIONS  1. Left ventricular ejection fraction, by visual estimation, is 55 to 60%. The left ventricle has normal function. There is no left ventricular hypertrophy.  2. The left ventricle demonstrates regional wall motion abnormalities.  3. No change from previous echo in 2019.  4. Global right ventricle has normal systolic function.The right ventricular size is normal. No increase in right ventricular wall thickness.  5. Left atrial size was normal.  6. Right atrial size was normal.  7. The mitral valve is normal in structure. Trivial mitral valve regurgitation.  8. The tricuspid valve is normal in structure. Tricuspid valve regurgitation is trivial.  9. The aortic valve is normal in structure. Aortic valve regurgitation is trivial. 10. The pulmonic valve was normal in structure. Pulmonic  valve regurgitation is not visualized. 11. Normal pulmonary artery systolic pressure. FINDINGS  Left Ventricle: Left ventricular ejection fraction, by visual estimation, is 55 to 60%. The left ventricle has normal function. The left ventricle demonstrates regional wall motion abnormalities. The left ventricular internal cavity size was the left ventricle is normal in size. There is no left ventricular hypertrophy. No change from previous echo in 2019. Right Ventricle: The right ventricular size is normal. No increase in right ventricular wall thickness. Global RV systolic function is has normal systolic function. The tricuspid regurgitant velocity is 1.76 m/s, and with an assumed right atrial pressure  of 3 mmHg, the estimated right ventricular systolic pressure is normal at 15.4 mmHg. Left Atrium: Left atrial size was normal in size. Right Atrium: Right atrial size was normal in size Pericardium: There is no evidence of pericardial effusion. Mitral Valve: The mitral valve is normal in structure. Trivial mitral valve regurgitation. Tricuspid Valve: The tricuspid valve is normal in structure. Tricuspid  valve regurgitation is trivial. Aortic Valve: The aortic valve is normal in structure. Aortic valve regurgitation is trivial. Pulmonic Valve: The pulmonic valve was normal in structure. Pulmonic valve regurgitation is not visualized. Pulmonic regurgitation is not visualized. Aorta: The aortic root is normal in size and structure. IAS/Shunts: No atrial level shunt detected by color flow Doppler.  LEFT VENTRICLE PLAX 2D LVIDd:         4.70 cm LVIDs:         3.90 cm LV PW:         1.10 cm LV IVS:        1.00 cm LVOT diam:     2.00 cm LV SV:         36 ml LV SV Index:   15.87 LVOT Area:     3.14 cm  LV Volumes (MOD) LV area d, A2C:    28.20 cm LV area d, A4C:    28.90 cm LV area s, A2C:    17.40 cm LV area s, A4C:    15.00 cm LV major d, A2C:   8.48 cm LV major d, A4C:   8.31 cm LV major s, A2C:   7.02 cm LV major s, A4C:   6.44 cm LV vol d, MOD A2C: 79.1 ml LV vol d, MOD A4C: 83.2 ml LV vol s, MOD A2C: 36.4 ml LV vol s, MOD A4C: 28.9 ml LV SV MOD A2C:     42.7 ml LV SV MOD A4C:     83.2 ml LV SV MOD BP:      48.1 ml RIGHT VENTRICLE RV S prime:     12.80 cm/s TAPSE (M-mode): 2.5 cm LEFT ATRIUM             Index       RIGHT ATRIUM           Index LA diam:        4.00 cm 1.77 cm/m  RA Area:     14.80 cm LA Vol (A2C):   28.6 ml 12.65 ml/m RA Volume:   38.20 ml  16.89 ml/m LA Vol (A4C):   35.5 ml 15.70 ml/m LA Biplane Vol: 35.0 ml 15.48 ml/m  AORTIC VALVE LVOT Vmax:   92.70 cm/s LVOT Vmean:  72.700 cm/s LVOT VTI:    0.155 m  AORTA Ao Root diam: 3.40 cm Ao Asc diam:  3.70 cm TRICUSPID VALVE TR Peak grad:   12.4 mmHg TR Vmax:  176.00 cm/s  SHUNTS Systemic VTI:  0.16 m Systemic Diam: 2.00 cm  Dietrich PatesPaula Ross MD Electronically signed by Dietrich PatesPaula Ross MD Signature Date/Time: 11/22/2019/11:51:51 AM    Final        LAB RESULTS: Basic Metabolic Panel: Recent Labs  Lab 11/21/19 1748 11/21/19 2047 11/24/19 0228 11/25/19 0205  NA  --    < > 137 137  K  --    < > 3.4* 3.3*  CL  --    < > 101 102  CO2  --    <  > 27 24  GLUCOSE  --    < > 82 92  BUN  --    < > 13 14  CREATININE  --   --  1.31* 1.21  CALCIUM  --    < > 9.1 8.6*  MG 2.2  --   --   --   PHOS 2.8  --   --   --    < > = values in this interval not displayed.   Liver Function Tests: Recent Labs  Lab 11/21/19 1259 11/22/19 0209  AST 61* 49*  ALT 33 27  ALKPHOS 128* 108  BILITOT 0.9 0.8  PROT 7.0 6.4*  ALBUMIN 3.5 3.3*   No results for input(s): LIPASE, AMYLASE in the last 168 hours. No results for input(s): AMMONIA in the last 168 hours. CBC: Recent Labs  Lab 11/24/19 0228 11/25/19 0205  WBC 11.2* 10.4  NEUTROABS 7.8* 6.8  HGB 11.5* 10.8*  HCT 33.9* 31.9*  MCV 109.0* 109.2*  PLT 357 316   Cardiac Enzymes: No results for input(s): CKTOTAL, CKMB, CKMBINDEX, TROPONINI in the last 168 hours. BNP: Invalid input(s): POCBNP CBG: Recent Labs  Lab 11/25/19 0418 11/25/19 0808  GLUCAP 92 138*      Disposition and Follow-up: Discharge Instructions    Diet - low sodium heart healthy   Complete by: As directed    Increase activity slowly   Complete by: As directed        DISPOSITION: Home   DISCHARGE FOLLOW-UP Follow-up Information    [facility]. Schedule an appointment as soon as possible for a visit in 2 week(s).   Why: need to recheck TSH and thyroid labs Contact information: [address] [place] [phone]           Time coordinating discharge:  35 minutes  Signed:   Thad Rangeripudeep Osha Errico M.D. [facility] 11/25/2019, 10:55 AM

## 2019-11-25 NOTE — Progress Notes (Signed)
Pt discharged from facility with all personal belongings. I went over discharge instructions and pt verbalized understanding of all information. Pt was also given prescriptions, letter for copay override, and pt's living will. IVs removed

## 2019-11-26 LAB — CULTURE, BLOOD (ROUTINE X 2)
Culture: NO GROWTH
Culture: NO GROWTH
Special Requests: ADEQUATE

## 2019-12-15 NOTE — Progress Notes (Deleted)
Patient ID: Bruce Simpson, male   DOB: 04-25-1966, 54 y.o.   MRN: [id number]   After hospitalization 11/21/19-11/25/19.  From discharge summary:  Recommendations for Outpatient Follow-up:  1. Follow up with PCP in 1-2 weeks 2. 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 COPD with acute exacerbation (HCC) . Hypotension . Hypokalemia . Hypothermia . Hyponatremia . Mild AKI . Alcohol abuse . Hyponatremia  Brief H and P: For complete details please refer to admission H and P, but in brief *[name] has PMH of alcohol abuse, emphysema, tobacco use, hypothyroidism with previous myxedema coma, and medication noncompliance,presentsto the ED with complaints of shortness of breath and cough scantly productive of tan sputum x 1 week.In the ED, his O2 sats were stable on RA but he was noted to have hypothermia on arrival with temp of 97.38F and hypotension with BP of 81/64. He was started on IV fluids bolus and his BP and temperature improved. CXR was negative for consolidation though it showed emphysematous changes.Labwork was remarkable for TSH of 37.911, mild hyponatremia of 130, K of 3.2, Scr of 1.14.EKG showed prolonged QTc of 538 but no acute ST changes. COVID-19 test was negative.Pt admitted for further management.  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  Continue Synthroid at 100 MCG daily -Blood  cultures negative till date, 2D echo showed EF of 55 to 60%. -Please check TSH, free T4, T3 in 4 weeks, adjust Synthroid dose based on the thyroid panel   Mild acute COPD exacerbation -Wheezing improved, patient counseled on smoking cessation. -Continue albuterol inhaler, Incruse Ellipta, doxycycline  Hyponatremia, hypokalemia -Likely due to profound hypothyroidism, sodium now improved to 137 -Potassium was replaced  Mild AKI -Creatinine today 1.3, was 1.1 on 12/15 -Placed on gentle hydration, creatinine 1.2 at the time of discharge  Prolonged QTC -Monitor EKG outpatient, avoid QTC prolonging meds, keep potassium above 4, mag>2  Alcohol/tobacco use -Counseled on smoking cessation -Currently not in any acute withdrawals, back to baseline  Social work was consulted to provide resources to the patient on shelters and housing.  Case management was consulted on match letter and med assistance.

## 2019-12-16 ENCOUNTER — Ambulatory Visit: Payer: Self-pay | Attending: Family Medicine

## 2019-12-16 ENCOUNTER — Other Ambulatory Visit: Payer: Self-pay

## 2023-04-24 ENCOUNTER — Other Ambulatory Visit: Payer: Self-pay | Admitting: Family

## 2023-04-24 DIAGNOSIS — F17200 Nicotine dependence, unspecified, uncomplicated: Secondary | ICD-10-CM

## 2023-04-24 DIAGNOSIS — J42 Unspecified chronic bronchitis: Secondary | ICD-10-CM

## 2023-04-24 DIAGNOSIS — Z720 Tobacco use: Secondary | ICD-10-CM

## 2023-05-07 ENCOUNTER — Institutional Professional Consult (permissible substitution) (HOSPITAL_BASED_OUTPATIENT_CLINIC_OR_DEPARTMENT_OTHER): Payer: Self-pay | Admitting: Pulmonary Disease

## 2023-05-29 ENCOUNTER — Other Ambulatory Visit: Payer: Self-pay | Admitting: Family

## 2023-05-29 DIAGNOSIS — Z136 Encounter for screening for cardiovascular disorders: Secondary | ICD-10-CM

## 2023-05-29 DIAGNOSIS — Z87891 Personal history of nicotine dependence: Secondary | ICD-10-CM

## 2023-05-29 DIAGNOSIS — Z720 Tobacco use: Secondary | ICD-10-CM

## 2023-05-30 ENCOUNTER — Ambulatory Visit (INDEPENDENT_AMBULATORY_CARE_PROVIDER_SITE_OTHER): Payer: BLUE CROSS/BLUE SHIELD | Admitting: Pulmonary Disease

## 2023-05-30 ENCOUNTER — Encounter (HOSPITAL_BASED_OUTPATIENT_CLINIC_OR_DEPARTMENT_OTHER): Payer: Self-pay | Admitting: Pulmonary Disease

## 2023-05-30 VITALS — BP 110/78 | HR 79 | Temp 97.5°F | Ht 74.0 in | Wt 244.0 lb

## 2023-05-30 DIAGNOSIS — J432 Centrilobular emphysema: Secondary | ICD-10-CM

## 2023-05-30 DIAGNOSIS — G4734 Idiopathic sleep related nonobstructive alveolar hypoventilation: Secondary | ICD-10-CM | POA: Diagnosis not present

## 2023-05-30 DIAGNOSIS — J9611 Chronic respiratory failure with hypoxia: Secondary | ICD-10-CM | POA: Diagnosis not present

## 2023-05-30 NOTE — Addendum Note (Signed)
Addended by: Lajoyce Lauber A on: 05/30/2023 11:45 AM   Modules accepted: Orders

## 2023-05-30 NOTE — Progress Notes (Signed)
[facility]  Chief Complaint  Patient presents with   Consult    Consult. Patient is here to talk about COPD    Past Surgical History:  He  has a past surgical history that includes No past surgeries.  Past Medical History:  Depression, ETOH, Latent TB, Hypothyroidism, Pneumonia January 2019  Constitutional:  BP 110/78 (BP Location: Right Arm, Patient Position: Sitting, Cuff Size: Normal)   Pulse 79   Temp (!) 97.5 F (36.4 C) (Oral)   Ht 6\' 2"  (1.88 m)   Wt 244 lb (110.7 kg)   SpO2 90%   BMI 31.33 kg/m   Brief Summary:  Bruce Simpson is a 57 y.o. male smoker with emphysema.      Subjective:   He started smoking at age 74.  He smokes 1 ppd.  He thinks his mother had emphysema and used to wear oxygen.  He had exposure to TB in the 1990's and was treated for several months, but was never told he actually had an infection.  He had pneumonia a few years ago.  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    He has a cough with clear sputum.  Gets wheezing at times.  Denies chest pain or hemoptysis.  He gets winded with minimal activity.  He snores, but doesn't feel like he has breathing issues otherwise while asleep.  No history of thromboembolic disease.  No having leg swelling.  He was started recently on breztri and this helps.  His SpO2 while standing dropped to 86% on room air.  He was recovered on 2 liters oxygen to an SpO2 of 97%.  Physical Exam:   Appearance - well kempt   ENMT - no sinus tenderness, no oral exudate, no LAN, Mallampati 4 airway, no stridor, edentulous  Respiratory - decreased breath sounds bilaterally, no wheezing or rales  CV - s1s2 regular rate and rhythm, no murmurs  Ext - no clubbing, no edema  Skin - no rashes  Psych - normal mood and affect   Pulmonary testing:    Chest Imaging:    Sleep Tests:    Cardiac Tests:  Echo  11/22/19 >> EF 55 to 60%  Social History:  He  reports that he has been smoking cigarettes. He has a 34.00 pack-year smoking history. He has quit using smokeless tobacco.  His smokeless tobacco use included snuff. He reports current alcohol use. He reports current drug use. Drug: Marijuana.  Family History:  His family history includes Emphysema in his father; Heart failure in his mother.     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 - will arrange for pulmonary function test and Alpha 1 Antitrypsin level to assess further - continue breztri two puffs bid - prn albuterol - reviewed the roles for his different inhalers - discussed importance of maintaining his vaccination schedule  Chronic respiratory failure with hypoxia. - will arrange for 2 liters oxygen with exertion - will have him assessed for a portable oxygen concentrator and arrange for an overnight oximetry on room air to determine if he to wear oxygen at night or if he might need a sleep study  Tobacco abuse. 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 - explained that his CT chest would  any evidence of silicosis  Hypothyroidism. - he reports compliance with thyroid replacement therapy  Time Spent Involved in Patient Care on Day of Examination:  65 minutes  Follow up:   Patient Instructions  Continue Breztri two puffs in the morning and two puffs in the evening, and rinse your mouth after each use  Albuterol two puffs every 6 hours as needed for cough, wheeze, chest congestion or shortness of breath  Lab test today  Will arrange for pulmonary function test  Will arrange for home oxygen set up  Follow up in 8 weeks  Medication List:   Allergies as of 05/30/2023   No Known Allergies      Medication List        Accurate as of May 30, 2023  11:11 AM. If you have any questions, ask your nurse or doctor.          STOP taking these medications    umeclidinium bromide 62.5 MCG/INH Aepb Commonly known as: INCRUSE ELLIPTA Stopped by: Coralyn Helling, MD       TAKE these medications    albuterol 108 (90 Base) MCG/ACT inhaler Commonly known as: VENTOLIN HFA Inhale 2 puffs into the lungs every 6 (six) hours as needed for wheezing or shortness of breath.   Breztri Aerosphere 160-9-4.8 MCG/ACT Aero Generic drug: Budeson-Glycopyrrol-Formoterol Inhale 2 puffs into the lungs in the morning and at bedtime.   guaiFENesin 600 MG 12 hr tablet Commonly known as: MUCINEX Take 1 tablet (600 mg total) by mouth 2 (two) times daily.   levothyroxine 100 MCG tablet Commonly known as: SYNTHROID Take 1 tablet (100 mcg total) by mouth daily before breakfast.        Signature:  Coralyn Helling, MD [facility] Pager - [phone] 05/30/2023, 11:11 AM

## 2023-05-30 NOTE — Patient Instructions (Signed)
Continue Breztri two puffs in the morning and two puffs in the evening, and rinse your mouth after each use  Albuterol two puffs every 6 hours as needed for cough, wheeze, chest congestion or shortness of breath  Lab test today  Will arrange for pulmonary function test  Will arrange for home oxygen set up  Follow up in 8 weeks

## 2023-06-03 LAB — ALPHA-1-ANTITRYPSIN PHENOTYP: A-1 Antitrypsin: 142 mg/dL (ref 101–187)

## 2023-06-05 ENCOUNTER — Ambulatory Visit
Admission: RE | Admit: 2023-06-05 | Discharge: 2023-06-05 | Disposition: A | Discharge status: Home / Self Care | Payer: BLUE CROSS/BLUE SHIELD | Source: Ambulatory Visit | Attending: Family | Admitting: Family

## 2023-06-05 DIAGNOSIS — Z87891 Personal history of nicotine dependence: Secondary | ICD-10-CM

## 2023-06-05 DIAGNOSIS — Z720 Tobacco use: Secondary | ICD-10-CM

## 2023-06-05 DIAGNOSIS — Z136 Encounter for screening for cardiovascular disorders: Secondary | ICD-10-CM

## 2023-06-10 ENCOUNTER — Telehealth: Payer: Self-pay | Admitting: Pulmonary Disease

## 2023-06-10 NOTE — Telephone Encounter (Signed)
ONO with RA 06/06/23 >> test time 6 hrs 16 min.  Basal SpO2 86.8%, low SpO2 77%.  Spent 5 hrs 16 min with an SpO2 < 88%.   Please let him know his oxygen is low at night.  He should wear 2 liters oxygen at night with sleep.

## 2023-06-13 NOTE — Telephone Encounter (Signed)
ATC patient. Per DPR, left detailed vm letting pt know what his results were. Advised him to give [place] a call if he had any questions.   Nothing further needed.

## 2023-07-09 ENCOUNTER — Other Ambulatory Visit: Payer: BLUE CROSS/BLUE SHIELD

## 2023-08-12 ENCOUNTER — Encounter (HOSPITAL_BASED_OUTPATIENT_CLINIC_OR_DEPARTMENT_OTHER): Payer: Medicaid Other

## 2023-08-12 ENCOUNTER — Ambulatory Visit (HOSPITAL_BASED_OUTPATIENT_CLINIC_OR_DEPARTMENT_OTHER): Payer: BLUE CROSS/BLUE SHIELD | Admitting: Pulmonary Disease

## 2023-09-19 ENCOUNTER — Ambulatory Visit (HOSPITAL_BASED_OUTPATIENT_CLINIC_OR_DEPARTMENT_OTHER): Payer: BLUE CROSS/BLUE SHIELD | Admitting: Pulmonary Disease

## 2023-09-19 DIAGNOSIS — J432 Centrilobular emphysema: Secondary | ICD-10-CM

## 2023-09-19 LAB — PULMONARY FUNCTION TEST
DL/VA % pred: 58 %
DL/VA: 2.52 ml/min/mmHg/L
DLCO cor % pred: 49 %
DLCO cor: 15.13 ml/min/mmHg
DLCO unc % pred: 49 %
DLCO unc: 15.13 ml/min/mmHg
FEF 25-75 Post: 1.21 L/s
FEF 25-75 Pre: 0.93 L/s
FEF2575-%Change-Post: 30 %
FEF2575-%Pred-Post: 35 %
FEF2575-%Pred-Pre: 27 %
FEV1-%Change-Post: 8 %
FEV1-%Pred-Post: 60 %
FEV1-%Pred-Pre: 56 %
FEV1-Post: 2.45 L
FEV1-Pre: 2.27 L
FEV1FVC-%Change-Post: 1 %
FEV1FVC-%Pred-Pre: 71 %
FEV6-%Change-Post: 8 %
FEV6-%Pred-Post: 84 %
FEV6-%Pred-Pre: 77 %
FEV6-Post: 4.26 L
FEV6-Pre: 3.91 L
FEV6FVC-%Change-Post: 1 %
FEV6FVC-%Pred-Post: 99 %
FEV6FVC-%Pred-Pre: 97 %
FVC-%Change-Post: 6 %
FVC-%Pred-Post: 84 %
FVC-%Pred-Pre: 78 %
FVC-Post: 4.45 L
FVC-Pre: 4.16 L
Post FEV1/FVC ratio: 55 %
Post FEV6/FVC ratio: 96 %
Pre FEV1/FVC ratio: 55 %
Pre FEV6/FVC Ratio: 94 %
RV % pred: 216 %
RV: 4.87 L
TLC % pred: 133 %
TLC: 9.87 L

## 2023-09-19 NOTE — Progress Notes (Signed)
Full PFT Performed Today  

## 2023-09-19 NOTE — Patient Instructions (Signed)
Full PFT Performed Today  

## 2023-09-23 ENCOUNTER — Other Ambulatory Visit (HOSPITAL_COMMUNITY): Payer: Self-pay

## 2023-09-23 ENCOUNTER — Encounter (HOSPITAL_BASED_OUTPATIENT_CLINIC_OR_DEPARTMENT_OTHER): Payer: Self-pay | Admitting: Pulmonary Disease

## 2023-09-23 ENCOUNTER — Ambulatory Visit (HOSPITAL_BASED_OUTPATIENT_CLINIC_OR_DEPARTMENT_OTHER): Payer: BLUE CROSS/BLUE SHIELD | Admitting: Pulmonary Disease

## 2023-09-23 VITALS — BP 128/82 | HR 68 | Resp 18 | Ht 72.0 in | Wt 237.6 lb

## 2023-09-23 DIAGNOSIS — J432 Centrilobular emphysema: Secondary | ICD-10-CM | POA: Diagnosis not present

## 2023-09-23 DIAGNOSIS — J449 Chronic obstructive pulmonary disease, unspecified: Secondary | ICD-10-CM | POA: Insufficient documentation

## 2023-09-23 DIAGNOSIS — J9611 Chronic respiratory failure with hypoxia: Secondary | ICD-10-CM | POA: Diagnosis not present

## 2023-09-23 MED ORDER — BREZTRI AEROSPHERE 160-9-4.8 MCG/ACT IN AERO: 2.0000 | INHALATION_SPRAY | Freq: Two times a day (BID) | RESPIRATORY_TRACT | 5 refills | Status: DC

## 2023-09-23 MED ORDER — FLUTICASONE-SALMETEROL 230-21 MCG/ACT IN AERO: 2.0000 | INHALATION_SPRAY | Freq: Two times a day (BID) | RESPIRATORY_TRACT | 5 refills | Status: DC

## 2023-09-23 NOTE — Addendum Note (Signed)
Addended by: Luciano Cutter on: 09/23/2023 02:52 PM   Modules accepted: Orders

## 2023-09-23 NOTE — Progress Notes (Addendum)
[place] [facility]  Chief Complaint  Patient presents with   Follow-up    Bruce Simpson patient. PFT FU. He stopped smoking cigarettes but does vape. Not as often. Also using lung detox.     Past Surgical History:  He  has a past surgical history that includes No past surgeries.  Past Medical History:  Depression, ETOH, Latent TB, Hypothyroidism, Pneumonia January 2019  Constitutional:  BP 128/82   Pulse 68   Resp 18   Ht 6' (1.829 m)   Wt 237 lb 9.6 oz (107.8 kg)   SpO2 96%   BMI 32.22 kg/m   Brief Summary:  Bruce Simpson is a 57 y.o. male smoker with emphysema.      Subjective:   Initial consult He started smoking at age 63.  He smokes 1 ppd.  He thinks his mother had emphysema and used to wear oxygen.  He had exposure to TB in the 1990's and was treated for several months, but was never told he actually had an infection.  He had pneumonia a few years ago.  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    He has a cough with clear sputum.  Gets wheezing at times.  Denies chest pain or hemoptysis.  He gets winded with minimal activity.  He snores, but doesn't feel like he has breathing issues otherwise while asleep.  No history of thromboembolic disease.  No having leg swelling.  He was started recently on breztri and this helps.  His SpO2 while standing dropped to 86% on room air.  He was recovered on 2 liters oxygen to an SpO2 of 97%.  09/23/23 He reports shortness of breath of activity including mopping. Colleagues noticed wheezing after doing activities and walking. Occasional dry cough. Currently vaping but previously smoking 1 ppd. Has tried nicotine gums. He reports snoring. No witnessed apnea.  Physical Exam:   Physical Exam: General: Well-appearing, no acute distress HENT: [place], AT Eyes: EOMI, no scleral icterus Respiratory: Clear to auscultation bilaterally.  No  crackles, wheezing or rales Cardiovascular: RRR, -M/R/G, no JVD Extremities:-Edema,-tenderness Neuro: AAO x4, CNII-XII grossly intact Psych: Normal mood, normal affect    Pulmonary testing:  09/19/23 FVC 4.45 (84%) FEV1 2.45 (60%) Ratio 55  TLC 133% DLCO 49% Interpretation: Moderate obstructive defect with air trapping and reduced DLCO consistent with emphysema  Chest Imaging:    Sleep Tests:    Cardiac Tests:  Echo 11/22/19 >> EF 55 to 60%  Social History:  He  reports that he has been smoking cigarettes. He has a 34 pack-year smoking history. He has quit using smokeless tobacco.  His smokeless tobacco use included snuff. He reports current alcohol use. He reports current drug use. Drug: Marijuana.  Family History:  His family history includes Emphysema in his father; Heart failure in his mother.     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 - Alpha 1 Antitrypsin - normal - Reviewed PFTs. Moderate emphysema - Continue Breztri two puffs bid. Will provide coupon      ADDENDUM: Advair HFA $0 co-pay. Patient prefers this - prn albuterol - Will message pharmacy team for alternative options  Chronic respiratory failure with hypoxia. - Wear 2 liters oxygen with exertion and sleep - Counseled on oxygen compliance - Consider sleep test in future. Patient declines for now  Tobacco abuse. 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 - explained that his CT chest would any evidence of silicosis  Time Spent Involved in Patient Care on Day of Examination:  45  Follow up:   There are no Patient Instructions on file for this visit.  Medication List:   Allergies as of 09/23/2023   No Known Allergies      Medication List        Accurate as of September 23, 2023 11:44 AM. If you have any questions, ask your nurse or doctor.           albuterol 108 (90 Base) MCG/ACT inhaler Commonly known as: VENTOLIN HFA Inhale 2 puffs into the lungs every 6 (six) hours as needed for wheezing or shortness of breath.   Breztri Aerosphere 160-9-4.8 MCG/ACT Aero Generic drug: Budeson-Glycopyrrol-Formoterol Inhale 2 puffs into the lungs in the morning and at bedtime.   guaiFENesin 600 MG 12 hr tablet Commonly known as: MUCINEX Take 1 tablet (600 mg total) by mouth 2 (two) times daily.   levothyroxine 100 MCG tablet Commonly known as: SYNTHROID Take 1 tablet (100 mcg total) by mouth daily before breakfast.       I have spent a total time of 30-minutes on the day of the appointment including chart review, data review, collecting history, coordinating care and discussing medical diagnosis and plan with the patient/family. Past medical history, allergies, medications were reviewed. Pertinent imaging, labs and tests included in this note have been reviewed and interpreted independently by me.   Signature:  Mechele Collin, MD [facility] Pulmonary/Critical Care Pager - [phone] 09/23/2023, 11:44 AM

## 2023-09-23 NOTE — Patient Instructions (Signed)
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 - Alpha 1 Antitrypsin - normal - Reviewed PFTs. Moderate emphysema - Continue Breztri two puffs bid. Will provide coupon - prn albuterol  Chronic respiratory failure with hypoxia. - Wear 2 liters oxygen with exertion and sleep - Counseled on oxygen compliance - Consider sleep test in future. Patient declines for now  Tobacco abuse. - Spent 10 discussing smoking cessation options - He will try nicotine patches - Enrolled in lung cancer screening

## 2023-12-24 ENCOUNTER — Ambulatory Visit (HOSPITAL_BASED_OUTPATIENT_CLINIC_OR_DEPARTMENT_OTHER): Payer: BLUE CROSS/BLUE SHIELD | Admitting: Pulmonary Disease

## 2023-12-24 ENCOUNTER — Other Ambulatory Visit (HOSPITAL_COMMUNITY): Payer: Self-pay

## 2023-12-24 ENCOUNTER — Encounter (HOSPITAL_BASED_OUTPATIENT_CLINIC_OR_DEPARTMENT_OTHER): Payer: Self-pay | Admitting: Pulmonary Disease

## 2023-12-24 VITALS — BP 108/70 | HR 67 | Resp 18 | Ht 72.0 in | Wt 243.7 lb

## 2023-12-24 DIAGNOSIS — J432 Centrilobular emphysema: Secondary | ICD-10-CM

## 2023-12-24 DIAGNOSIS — J9611 Chronic respiratory failure with hypoxia: Secondary | ICD-10-CM | POA: Diagnosis not present

## 2023-12-24 DIAGNOSIS — F17210 Nicotine dependence, cigarettes, uncomplicated: Secondary | ICD-10-CM

## 2023-12-24 MED ORDER — ADVAIR HFA 230-21 MCG/ACT IN AERO: 2.0000 | INHALATION_SPRAY | Freq: Two times a day (BID) | RESPIRATORY_TRACT | 11 refills | Status: DC

## 2023-12-24 NOTE — Patient Instructions (Addendum)
 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 - Alpha 1 Antitrypsin - normal - Reviewed PFTs. Moderate emphysema - CONTINUE Advair  HFA 230-21 TWO puffs in the morning and evening       >Price check for Incruse Ellipta  copay is $173.87, Brand Spiriva copay is $163.45       >Will check prices again in the future once deductible is met - Encouraged regular aerobic exercise 20-30 min a week. Patient declined pulmonary rehab due to work schedule and transportation  Chronic respiratory failure with hypoxia - Counseled on oxygen compliance - Wear 2 liters oxygen with exertion and sleep - Consider sleep test in future. Patient declines for now  Tobacco abuse. - Spent >5 min discussing smoking cessation options - Previously tried nicotine  patches

## 2023-12-24 NOTE — Progress Notes (Signed)
 [place] [facility], [facility]  Chief Complaint  Patient presents with   Follow-up    Centrilobular emphysema    Past Surgical History:  He  has a past surgical history that includes No past surgeries.  Past Medical History:  Depression, ETOH, Latent TB, Hypothyroidism, Pneumonia January 2019  Constitutional:  BP 108/70   Pulse 67   Resp 18   Ht 6' (1.829 m)   Wt 243 lb 11.2 oz (110.5 kg)   SpO2 91%   BMI 33.05 kg/m   Brief Summary:  Bruce Simpson is a 58 y.o. male smoker with emphysema.      Subjective:   Initial consult He started smoking at age 10.  He smokes 1 ppd.  He thinks his mother had emphysema and used to wear oxygen.  He had exposure to TB in the 1990's and was treated for several months, but was never told he actually had an infection.  He had pneumonia a few years ago.  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    He has a cough with clear sputum.  Gets wheezing at times.  Denies chest pain or hemoptysis.  He gets winded with minimal activity.  He snores, but doesn't feel like he has breathing issues otherwise while asleep.  No history of thromboembolic disease.  No having leg swelling.  He was started recently on breztri  and this helps.  His SpO2 while standing dropped to 86% on room air.  He was recovered on 2 liters oxygen to an SpO2 of 97%.  09/23/23 He reports shortness of breath of activity including mopping. Colleagues noticed wheezing after doing activities and walking. Occasional dry cough. Currently vaping but previously smoking 1 ppd. Has tried nicotine  gums. He reports snoring. No witnessed apnea.  12/24/23 Since our last visit he has been on Advair  HFA 230. He still has breakthrough wheezing and shortness of breath. He has not been active during the holidays but restarting riding his bike 1/2 mile daily since restarting. Does not wear oxygen with activity.  Does wear at night. Continues to smoke/vaping.  Physical Exam:   Physical Exam: General: Well-appearing, no acute distress HENT: [place], AT Eyes: EOMI, no scleral icterus Respiratory: Clear to auscultation bilaterally.  No crackles, wheezing or rales Cardiovascular: RRR, -M/R/G, no JVD Extremities:-Edema,-tenderness Neuro: AAO x4, CNII-XII grossly intact Psych: Normal mood, normal affect     Pulmonary testing:  09/19/23 FVC 4.45 (84%) FEV1 2.45 (60%) Ratio 55  TLC 133% DLCO 49% Interpretation: Moderate obstructive defect with air trapping and reduced DLCO consistent with emphysema  Chest Imaging:    Sleep Tests:    Cardiac Tests:  Echo 11/22/19 >> EF 55 to 60%  Social History:  He  reports that he has been smoking cigarettes. He has a 34 pack-year smoking history. He has quit using smokeless tobacco.  His smokeless tobacco use included snuff. He reports current alcohol use. He reports current drug use. Drug: Marijuana.  Family History:  His family history includes Emphysema in his father; Heart failure in his mother.     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 - Alpha 1 Antitrypsin - normal - Reviewed PFTs. Moderate emphysema - CONTINUE Advair  HFA 230-21 TWO puffs in the morning and evening       >Price check for Incruse Ellipta  copay is $173.87, Brand Spiriva copay is $163.45       >Will  check prices again in the future once deductible is met  Chronic respiratory failure with hypoxia - Counseled on oxygen compliance - Wear 2 liters oxygen with exertion and sleep - Consider sleep test in future. Patient declines for now  Tobacco abuse. 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 - Alpha 1 Antitrypsin - normal - Reviewed PFTs. Moderate emphysema - CONTINUE Advair  HFA 230-21 TWO puffs in the morning and evening       >Price check for Incruse Ellipta  copay is $173.87, Brand Spiriva copay is $163.45       >Will check prices again in the future once deductible is met - Encouraged regular aerobic exercise 20-30 min a week. Patient declined pulmonary rehab due to work schedule and transportation  Chronic respiratory failure with hypoxia - Counseled on oxygen compliance - Wear 2 liters oxygen with exertion and sleep - Consider sleep test in future. Patient declines for now  Tobacco abuse. - Spent >5 min discussing smoking cessation options - Previously tried nicotine  patches  Medication List:   Allergies as of 12/24/2023   No Known Allergies      Medication List        Accurate as of December 24, 2023 11:49 AM. If you have any questions, ask your nurse or doctor.          STOP taking these medications    Breztri  Aerosphere 160-9-4.8 MCG/ACT Aero Generic drug: Budeson-Glycopyrrol-Formoterol Stopped by: Quillian Brunt   fluticasone -salmeterol 230-21 MCG/ACT inhaler Commonly known as: Advair  HFA Replaced by: Advair  HFA 230-21 MCG/ACT inhaler Stopped by: Quillian Brunt       TAKE these medications    Advair  HFA 230-21 MCG/ACT inhaler Generic drug: fluticasone -salmeterol Inhale 2 puffs into the lungs 2 (two) times daily. Replaces: fluticasone -salmeterol 230-21 MCG/ACT inhaler Started by: Quillian Brunt   albuterol  108 (90 Base) MCG/ACT inhaler Commonly known as: VENTOLIN  HFA Inhale 2 puffs into the lungs every 6 (six) hours as needed for wheezing or shortness of breath.   guaiFENesin  600 MG 12 hr tablet Commonly known as: MUCINEX  Take 1 tablet (600 mg total) by mouth 2 (two) times daily.   levothyroxine  100 MCG tablet Commonly known as: SYNTHROID  Take 1 tablet (100 mcg  total) by mouth daily before breakfast. What changed: how much to take       I have spent a total time of 30-minutes on the day of the appointment including chart review, data review, collecting history, coordinating care and discussing medical diagnosis and plan with the patient/family. Past medical history, allergies, medications were reviewed. Pertinent imaging, labs and tests included in this note have been reviewed and interpreted independently by me.  Signature:  Genetta Kenning, MD [facility] Pulmonary/Critical Care Pager - [phone] 12/24/2023, 11:49 AM

## 2024-06-17 ENCOUNTER — Other Ambulatory Visit: Payer: Self-pay | Admitting: Family

## 2024-06-17 DIAGNOSIS — Z122 Encounter for screening for malignant neoplasm of respiratory organs: Secondary | ICD-10-CM

## 2024-06-17 DIAGNOSIS — F17200 Nicotine dependence, unspecified, uncomplicated: Secondary | ICD-10-CM

## 2024-08-20 ENCOUNTER — Ambulatory Visit (HOSPITAL_BASED_OUTPATIENT_CLINIC_OR_DEPARTMENT_OTHER): Admitting: Pulmonary Disease

## 2024-11-15 ENCOUNTER — Other Ambulatory Visit (HOSPITAL_BASED_OUTPATIENT_CLINIC_OR_DEPARTMENT_OTHER): Payer: Self-pay

## 2024-11-15 ENCOUNTER — Encounter (HOSPITAL_BASED_OUTPATIENT_CLINIC_OR_DEPARTMENT_OTHER): Payer: Self-pay | Admitting: Pulmonary Disease

## 2024-11-15 ENCOUNTER — Ambulatory Visit (INDEPENDENT_AMBULATORY_CARE_PROVIDER_SITE_OTHER): Payer: MEDICAID | Admitting: Pulmonary Disease

## 2024-11-15 VITALS — BP 147/90 | HR 74 | Ht 72.0 in | Wt 221.0 lb

## 2024-11-15 DIAGNOSIS — J441 Chronic obstructive pulmonary disease with (acute) exacerbation: Secondary | ICD-10-CM

## 2024-11-15 DIAGNOSIS — J9611 Chronic respiratory failure with hypoxia: Secondary | ICD-10-CM

## 2024-11-15 DIAGNOSIS — J432 Centrilobular emphysema: Secondary | ICD-10-CM

## 2024-11-15 MED ORDER — ALBUTEROL SULFATE HFA 108 (90 BASE) MCG/ACT IN AERS: 2.0000 | INHALATION_SPRAY | Freq: Four times a day (QID) | RESPIRATORY_TRACT | 3 refills | Status: AC | PRN

## 2024-11-15 MED ORDER — PREDNISONE 10 MG PO TABS: ORAL_TABLET | ORAL | 0 refills | Status: AC

## 2024-11-15 NOTE — Patient Instructions (Addendum)
 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 Exacerbation - Prednisone  taper - CONTINUE Advair  HFA 230-21 TWO puffs in the morning and evening - CONTINUE Albuterol  TWO puffs AS NEEDED  Chronic respiratory failure with hypoxia - Ambulatory O2 in office today

## 2024-11-15 NOTE — Progress Notes (Unsigned)
 [place] [facility], [facility] Sleep Medicine  Chief Complaint  Patient presents with   centrilobular emphysema    Follow up     Past Surgical History:  He  has a past surgical history that includes No past surgeries.  Past Medical History:  Depression, ETOH, Latent TB, Hypothyroidism, Pneumonia January 2019  Constitutional:  BP (!) 147/90   Pulse 74   Ht 6' (1.829 m)   Wt 221 lb (100.2 kg)   SpO2 94%   BMI 29.97 kg/m   Brief Summary:  Bruce Simpson is a 58 y.o. male smoker with emphysema.      Subjective:   Initial consult He started smoking at age 29.  He smokes 1 ppd.  He thinks his mother had emphysema and used to wear oxygen.  He had exposure to TB in the 1990's and was treated for several months, but was never told he actually had an infection.  He had pneumonia a few years ago.  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    He has a cough with clear sputum.  Gets wheezing at times.  Denies chest pain or hemoptysis.  He gets winded with minimal activity.  He snores, but doesn't feel like he has breathing issues otherwise while asleep.  No history of thromboembolic disease.  No having leg swelling.  He was started recently on breztri  and this helps.  His SpO2 while standing dropped to 86% on room air.  He was recovered on 2 liters oxygen to an SpO2 of 97%.  09/23/23 He reports shortness of breath of activity including mopping. Colleagues noticed wheezing after doing activities and walking. Occasional dry cough. Currently vaping but previously smoking 1 ppd. Has tried nicotine  gums. He reports snoring. No witnessed apnea.  12/24/23 Since our last visit he has been on Advair  HFA 230. He still has breakthrough wheezing and shortness of breath. He has not been active during the holidays but restarting riding his bike 1/2 mile daily since restarting. Does not wear oxygen with activity. Does wear at  night. Continues to smoke/vaping.  11/15/24 Since our last visit he had had coughing and wheezing has worsened in the last month. He is still riding his bike daily 1 mile. Continues smoking and does he because he is bored. No longer vaping. Compliant with Advair  HFA. Compliant with oxygen at night.  Physical Exam:   Physical Exam: General: Well-appearing, no acute distress HENT: [place], AT Eyes: EOMI, no scleral icterus Respiratory: Coarse to auscultation bilaterally.  Bilaterally wheezing Cardiovascular: RRR, -M/R/G, no JVD Extremities:-Edema,-tenderness Neuro: AAO x4, CNII-XII grossly intact Psych: Normal mood, normal affect     Pulmonary testing:  09/19/23 FVC 4.45 (84%) FEV1 2.45 (60%) Ratio 55  TLC 133% DLCO 49% Interpretation: Moderate obstructive defect with air trapping and reduced DLCO consistent with emphysema  Chest Imaging:    Sleep Tests:    Cardiac Tests:  Echo 11/22/19 >> EF 55 to 60%  Social History:  He  reports that he has been smoking cigarettes. He has a 34 pack-year smoking history. He has been exposed to tobacco smoke. He has quit using smokeless tobacco.  His smokeless tobacco use included snuff. He reports current alcohol use. He reports current drug use. Drug: Marijuana.  Family History:  His family history includes Emphysema in his father; Heart failure in his mother.     Assessment/Plan:   Moderate COPD with emphysema Exacerbation -  Alpha 1 Antitrypsin - normal - Prednisone  taper - CONTINUE Advair  HFA 230-21 TWO puffs in the morning and evening - CONTINUE Albuterol  TWO puffs AS NEEDED  Chronic respiratory failure with hypoxia - Ambulatory O2 today with no desaturations - Wear 2 liters oxygen with exertion and sleep - ORDER ONO on room air to re-qualify - DME: [facility]  Tobacco abuse Patient is an active smoker. 1 ppd.  We discussed smoking cessation for <3 minutes. We discussed triggers and stressors and ways to deal with them. We  discussed barriers to continued smoking and benefits of smoking cessation. Provided patient with information cessation techniques and interventions including [place] quitline. 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 - explained that his CT chest would any evidence of silicosis  Time Spent Involved in Patient Care on Day of Examination:  31 min  Follow up:   There are no Patient Instructions on file for this visit.  Medication List:   Allergies as of 11/15/2024   No Known Allergies      Medication List        Accurate as of November 15, 2024  3:43 PM. If you have any questions, ask your nurse or doctor.          Advair  HFA 230-21 MCG/ACT inhaler Generic drug: fluticasone -salmeterol Inhale 2 puffs into the lungs 2 (two) times daily.   albuterol  108 (90 Base) MCG/ACT inhaler Commonly known as: VENTOLIN  HFA Inhale 2 puffs into the lungs every 6 (six) hours as needed for wheezing or shortness of breath.   guaiFENesin  600 MG 12 hr tablet Commonly known as: MUCINEX  Take 1 tablet (600 mg total) by mouth 2 (two) times daily.   levothyroxine  100 MCG tablet Commonly known as: SYNTHROID  Take 1 tablet (100 mcg total) by mouth daily before breakfast. What changed: how much to take       I have spent a total time of 30-minutes on the day of the appointment including chart review, data review, collecting history, coordinating care and discussing medical diagnosis and plan with the patient/family. Past medical history, allergies, medications were reviewed. Pertinent imaging, labs and tests included in this note have been reviewed and interpreted independently by me.  Signature:  Slater Staff, MD [facility] Pulmonary/Critical Care Pager - [phone] 11/15/2024, 3:43 PM

## 2024-11-16 ENCOUNTER — Encounter (HOSPITAL_BASED_OUTPATIENT_CLINIC_OR_DEPARTMENT_OTHER): Payer: Self-pay | Admitting: Pulmonary Disease

## 2024-11-17 ENCOUNTER — Other Ambulatory Visit (HOSPITAL_COMMUNITY): Payer: Self-pay

## 2024-11-17 ENCOUNTER — Telehealth: Payer: Self-pay

## 2024-11-17 NOTE — Telephone Encounter (Signed)
*  Pulm  Pharmacy Patient Advocate Encounter   Received notification from Physician's Office that prior authorization for Breztri  is required/requested.   Insurance verification completed.   The patient is insured through [place] MEDICA[employer].   Per test claim: PA required; PA started via CoverMyMeds. KEY [id number] . Waiting for clinical questions to populate.

## 2024-11-17 NOTE — Telephone Encounter (Signed)
 Submitted to plan along with chart notes.

## 2024-11-19 NOTE — Telephone Encounter (Signed)
 The request does not meet the [place]  Medicaid Preferred Drug List (PDL) trial and failure criteria for the use of a non-preferred drug. To meet the criteria for approval, you must first try one of the following preferred drugs on the [place] Preferred Drug List (PDL) (this is a list of covered drugs):  Dulera Inhaler, Symbicort Inhaler (brand

## 2024-11-22 ENCOUNTER — Other Ambulatory Visit (HOSPITAL_BASED_OUTPATIENT_CLINIC_OR_DEPARTMENT_OTHER): Payer: Self-pay

## 2024-11-22 MED ORDER — SYMBICORT 160-4.5 MCG/ACT IN AERO: 2.0000 | INHALATION_SPRAY | Freq: Two times a day (BID) | RESPIRATORY_TRACT | 5 refills | Status: AC

## 2024-11-22 NOTE — Addendum Note (Signed)
 Addended by: KASSIE ACQUANETTA BRADLEY on: 11/22/2024 09:00 AM   Modules accepted: Orders

## 2024-11-22 NOTE — Telephone Encounter (Signed)
 Mychart message sent to patient: I have reviewed your inhaler options with our pharmacy team. Based off your insurance you are eligible for $4 inhalers. I will go ahead and prescribe brand name Symbicort  at your preferred pharmacy to pick up and use. Please let me know if you have any questions or concerns.

## 2024-11-23 ENCOUNTER — Telehealth (HOSPITAL_BASED_OUTPATIENT_CLINIC_OR_DEPARTMENT_OTHER): Payer: Self-pay

## 2024-11-23 NOTE — Telephone Encounter (Signed)
 LOV notes sent to [facility] confirmation received.

## 2024-12-15 ENCOUNTER — Telehealth (INDEPENDENT_AMBULATORY_CARE_PROVIDER_SITE_OTHER): Payer: Self-pay | Admitting: Pulmonary Disease

## 2024-12-15 DIAGNOSIS — J9611 Chronic respiratory failure with hypoxia: Secondary | ICD-10-CM

## 2024-12-15 NOTE — Telephone Encounter (Signed)
 Overnight Oximetry Results Date: 08/10/24 SpO2 <88% 2 hour 46 min 14 sec.  [place] SpO2 79%  Qualified for oxygen  Assessment/Plan Nocturnal Hypoxemia --RE-CERTIFY CONTINUE 2L O2 via [place] nightly  Signed prescription. Will fax back to DME

## 2024-12-15 NOTE — Addendum Note (Signed)
 Addended by: Zosia Lucchese L on: 12/15/2024 04:05 PM   Modules accepted: Orders

## 2024-12-17 ENCOUNTER — Telehealth (HOSPITAL_BASED_OUTPATIENT_CLINIC_OR_DEPARTMENT_OTHER): Payer: Self-pay

## 2024-12-17 NOTE — Telephone Encounter (Signed)
 CMN received for oxygen  sent to [facility] signed by provider and faxed confirmation received

## 2025-02-14 ENCOUNTER — Ambulatory Visit (HOSPITAL_BASED_OUTPATIENT_CLINIC_OR_DEPARTMENT_OTHER): Admitting: Pulmonary Disease
